# Patient Record
Sex: Female | Born: 1935 | Race: White | Hispanic: No | Smoking: Never smoker
Health system: Southern US, Community
[De-identification: ages and names within clinical notes are randomized; demographics above are authoritative.]

## PROBLEM LIST (undated history)

## (undated) DIAGNOSIS — I10 Essential (primary) hypertension: Secondary | ICD-10-CM

## (undated) DIAGNOSIS — E119 Type 2 diabetes mellitus without complications: Secondary | ICD-10-CM

## (undated) DIAGNOSIS — I639 Cerebral infarction, unspecified: Secondary | ICD-10-CM

## (undated) DIAGNOSIS — F0390 Unspecified dementia without behavioral disturbance: Secondary | ICD-10-CM

## (undated) DIAGNOSIS — C50919 Malignant neoplasm of unspecified site of unspecified female breast: Secondary | ICD-10-CM

## (undated) DIAGNOSIS — N1330 Unspecified hydronephrosis: Secondary | ICD-10-CM

## (undated) HISTORY — DX: Unspecified dementia, unspecified severity, without behavioral disturbance, psychotic disturbance, mood disturbance, and anxiety: F03.90

## (undated) HISTORY — DX: Type 2 diabetes mellitus without complications: E11.9

## (undated) HISTORY — DX: Cerebral infarction, unspecified: I63.9

## (undated) HISTORY — DX: Essential (primary) hypertension: I10

## (undated) HISTORY — PX: FOREIGN BODY REMOVAL: SHX962

## (undated) HISTORY — DX: Malignant neoplasm of unspecified site of unspecified female breast: C50.919

## (undated) HISTORY — PX: PERCUTANEOUS NEPHROSTOMY: SHX2208

## (undated) HISTORY — DX: Unspecified hydronephrosis: N13.30

---

## 1966-06-26 HISTORY — PX: OTHER SURGICAL HISTORY: SHX169

## 1998-05-13 ENCOUNTER — Other Ambulatory Visit: Admission: RE | Admit: 1998-05-13 | Discharge: 1998-05-13 | Payer: Self-pay | Admitting: *Deleted

## 1999-05-16 ENCOUNTER — Other Ambulatory Visit: Admission: RE | Admit: 1999-05-16 | Discharge: 1999-05-16 | Payer: Self-pay | Admitting: Family Medicine

## 2000-05-22 ENCOUNTER — Other Ambulatory Visit: Admission: RE | Admit: 2000-05-22 | Discharge: 2000-05-22 | Payer: Self-pay | Admitting: Family Medicine

## 2000-06-26 HISTORY — PX: BREAST LUMPECTOMY: SHX2

## 2001-07-09 ENCOUNTER — Other Ambulatory Visit: Admission: RE | Admit: 2001-07-09 | Discharge: 2001-07-09 | Payer: Self-pay | Admitting: Family Medicine

## 2001-12-24 ENCOUNTER — Encounter: Payer: Self-pay | Admitting: Family Medicine

## 2001-12-24 LAB — CONVERTED CEMR LAB: Hgb A1c MFr Bld: 5.4 %

## 2002-01-24 ENCOUNTER — Encounter: Payer: Self-pay | Admitting: Family Medicine

## 2002-01-24 LAB — CONVERTED CEMR LAB: Hgb A1c MFr Bld: 6.1 %

## 2002-07-27 ENCOUNTER — Encounter: Payer: Self-pay | Admitting: Family Medicine

## 2002-07-27 LAB — CONVERTED CEMR LAB: Hgb A1c MFr Bld: 5.6 %

## 2003-01-25 ENCOUNTER — Encounter: Payer: Self-pay | Admitting: Family Medicine

## 2003-01-25 LAB — CONVERTED CEMR LAB: Hgb A1c MFr Bld: 5.9 %

## 2003-02-04 ENCOUNTER — Other Ambulatory Visit: Admission: RE | Admit: 2003-02-04 | Discharge: 2003-02-04 | Payer: Self-pay | Admitting: Family Medicine

## 2003-02-04 ENCOUNTER — Encounter: Payer: Self-pay | Admitting: Family Medicine

## 2003-02-04 LAB — CONVERTED CEMR LAB: Pap Smear: NORMAL

## 2003-12-25 ENCOUNTER — Encounter: Payer: Self-pay | Admitting: Family Medicine

## 2003-12-25 LAB — CONVERTED CEMR LAB: Hgb A1c MFr Bld: 5.8 %

## 2004-06-26 ENCOUNTER — Encounter: Payer: Self-pay | Admitting: Family Medicine

## 2004-06-26 LAB — CONVERTED CEMR LAB: Hgb A1c MFr Bld: 5.4 %

## 2004-07-12 ENCOUNTER — Ambulatory Visit: Payer: Self-pay | Admitting: Family Medicine

## 2004-07-14 ENCOUNTER — Ambulatory Visit: Payer: Self-pay | Admitting: Family Medicine

## 2005-01-24 ENCOUNTER — Encounter: Payer: Self-pay | Admitting: Family Medicine

## 2005-02-07 ENCOUNTER — Ambulatory Visit: Payer: Self-pay | Admitting: Family Medicine

## 2005-02-09 ENCOUNTER — Ambulatory Visit: Payer: Self-pay | Admitting: Family Medicine

## 2005-02-24 ENCOUNTER — Ambulatory Visit: Payer: Self-pay | Admitting: Family Medicine

## 2005-07-27 ENCOUNTER — Encounter: Payer: Self-pay | Admitting: Family Medicine

## 2005-08-10 ENCOUNTER — Ambulatory Visit: Payer: Self-pay | Admitting: Family Medicine

## 2005-08-14 ENCOUNTER — Ambulatory Visit: Payer: Self-pay | Admitting: Family Medicine

## 2006-04-03 ENCOUNTER — Ambulatory Visit: Payer: Self-pay | Admitting: Family Medicine

## 2006-05-30 ENCOUNTER — Ambulatory Visit: Payer: Self-pay | Admitting: Family Medicine

## 2006-05-31 ENCOUNTER — Ambulatory Visit: Payer: Self-pay | Admitting: Family Medicine

## 2006-06-08 ENCOUNTER — Ambulatory Visit: Payer: Self-pay | Admitting: Family Medicine

## 2006-11-28 ENCOUNTER — Ambulatory Visit: Payer: Self-pay | Admitting: Family Medicine

## 2006-11-28 LAB — CONVERTED CEMR LAB: Hgb A1c MFr Bld: 5.9 % (ref 4.6–6.0)

## 2006-11-29 ENCOUNTER — Encounter: Payer: Self-pay | Admitting: Family Medicine

## 2006-11-29 DIAGNOSIS — E119 Type 2 diabetes mellitus without complications: Secondary | ICD-10-CM

## 2006-11-29 DIAGNOSIS — N200 Calculus of kidney: Secondary | ICD-10-CM | POA: Insufficient documentation

## 2006-11-29 DIAGNOSIS — I252 Old myocardial infarction: Secondary | ICD-10-CM

## 2006-11-29 DIAGNOSIS — K2960 Other gastritis without bleeding: Secondary | ICD-10-CM

## 2006-11-29 DIAGNOSIS — C50919 Malignant neoplasm of unspecified site of unspecified female breast: Secondary | ICD-10-CM | POA: Insufficient documentation

## 2006-11-29 DIAGNOSIS — I10 Essential (primary) hypertension: Secondary | ICD-10-CM | POA: Insufficient documentation

## 2006-11-29 DIAGNOSIS — G43909 Migraine, unspecified, not intractable, without status migrainosus: Secondary | ICD-10-CM | POA: Insufficient documentation

## 2006-11-30 ENCOUNTER — Ambulatory Visit: Payer: Self-pay | Admitting: Family Medicine

## 2007-01-31 ENCOUNTER — Ambulatory Visit: Payer: Self-pay | Admitting: Family Medicine

## 2007-02-04 ENCOUNTER — Encounter: Payer: Self-pay | Admitting: Family Medicine

## 2007-04-05 ENCOUNTER — Ambulatory Visit: Payer: Self-pay | Admitting: Family Medicine

## 2007-07-01 ENCOUNTER — Ambulatory Visit: Payer: Self-pay | Admitting: Family Medicine

## 2007-07-01 LAB — CONVERTED CEMR LAB
Albumin: 4.1 g/dL (ref 3.5–5.2)
Basophils Absolute: 0 10*3/uL (ref 0.0–0.1)
Cholesterol: 190 mg/dL (ref 0–200)
Creatinine, Ser: 1.2 mg/dL (ref 0.4–1.2)
HCT: 36.8 % (ref 36.0–46.0)
Hemoglobin: 12.7 g/dL (ref 12.0–15.0)
Hgb A1c MFr Bld: 5.8 % (ref 4.6–6.0)
LDL Cholesterol: 101 mg/dL — ABNORMAL HIGH (ref 0–99)
Lymphocytes Relative: 25.5 % (ref 12.0–46.0)
MCHC: 34.5 g/dL (ref 30.0–36.0)
MCV: 101.3 fL — ABNORMAL HIGH (ref 78.0–100.0)
Monocytes Absolute: 0.6 10*3/uL (ref 0.2–0.7)
Neutrophils Relative %: 64.2 % (ref 43.0–77.0)
Potassium: 4.9 meq/L (ref 3.5–5.1)
RDW: 11.9 % (ref 11.5–14.6)
Sodium: 143 meq/L (ref 135–145)
TSH: 1.21 microintl units/mL (ref 0.35–5.50)
Total Bilirubin: 1.3 mg/dL — ABNORMAL HIGH (ref 0.3–1.2)
Total Protein: 6.4 g/dL (ref 6.0–8.3)

## 2007-07-04 ENCOUNTER — Ambulatory Visit: Payer: Self-pay | Admitting: Family Medicine

## 2007-07-04 ENCOUNTER — Telehealth (INDEPENDENT_AMBULATORY_CARE_PROVIDER_SITE_OTHER): Payer: Self-pay | Admitting: *Deleted

## 2007-07-04 LAB — CONVERTED CEMR LAB: Microalb Creat Ratio: 8.7 mg/g (ref 0.0–30.0)

## 2007-07-12 ENCOUNTER — Ambulatory Visit: Payer: Self-pay | Admitting: Family Medicine

## 2007-07-15 ENCOUNTER — Encounter (INDEPENDENT_AMBULATORY_CARE_PROVIDER_SITE_OTHER): Payer: Self-pay | Admitting: *Deleted

## 2007-09-26 ENCOUNTER — Ambulatory Visit: Payer: Self-pay | Admitting: Family Medicine

## 2007-09-26 LAB — CONVERTED CEMR LAB
Bilirubin, Direct: 0.1 mg/dL (ref 0.0–0.3)
LDL Cholesterol: 90 mg/dL (ref 0–99)
Total Bilirubin: 0.9 mg/dL (ref 0.3–1.2)
Total CHOL/HDL Ratio: 2.6
VLDL: 15 mg/dL (ref 0–40)

## 2008-02-19 ENCOUNTER — Ambulatory Visit: Payer: Self-pay | Admitting: Family Medicine

## 2008-02-26 ENCOUNTER — Ambulatory Visit: Payer: Self-pay | Admitting: Family Medicine

## 2008-02-26 DIAGNOSIS — F0390 Unspecified dementia without behavioral disturbance: Secondary | ICD-10-CM

## 2008-06-04 ENCOUNTER — Encounter: Payer: Self-pay | Admitting: Family Medicine

## 2008-07-21 ENCOUNTER — Ambulatory Visit: Payer: Self-pay | Admitting: Family Medicine

## 2008-07-21 LAB — CONVERTED CEMR LAB
AST: 24 units/L (ref 0–37)
Albumin: 3.9 g/dL (ref 3.5–5.2)
Alkaline Phosphatase: 41 units/L (ref 39–117)
Basophils Absolute: 0 10*3/uL (ref 0.0–0.1)
CO2: 28 meq/L (ref 19–32)
Chloride: 109 meq/L (ref 96–112)
Cholesterol: 181 mg/dL (ref 0–200)
Creatinine,U: 130.6 mg/dL
Eosinophils Absolute: 0 10*3/uL (ref 0.0–0.7)
GFR calc Af Amer: 69 mL/min
Hgb A1c MFr Bld: 5.9 % (ref 4.6–6.0)
LDL Cholesterol: 88 mg/dL (ref 0–99)
Lymphocytes Relative: 24.6 % (ref 12.0–46.0)
MCHC: 34.6 g/dL (ref 30.0–36.0)
Microalb Creat Ratio: 23 mg/g (ref 0.0–30.0)
Neutro Abs: 4.4 10*3/uL (ref 1.4–7.7)
Neutrophils Relative %: 66.4 % (ref 43.0–77.0)
RDW: 11.9 % (ref 11.5–14.6)
Sodium: 145 meq/L (ref 135–145)
Total Protein: 6.5 g/dL (ref 6.0–8.3)
Triglycerides: 51 mg/dL (ref 0–149)
VLDL: 10 mg/dL (ref 0–40)
Vitamin B-12: 659 pg/mL (ref 211–911)

## 2008-07-22 LAB — CONVERTED CEMR LAB: Vit D, 1,25-Dihydroxy: 26 — ABNORMAL LOW (ref 30–89)

## 2008-07-28 ENCOUNTER — Ambulatory Visit: Payer: Self-pay | Admitting: Family Medicine

## 2008-08-13 ENCOUNTER — Encounter (INDEPENDENT_AMBULATORY_CARE_PROVIDER_SITE_OTHER): Payer: Self-pay | Admitting: *Deleted

## 2008-08-13 ENCOUNTER — Ambulatory Visit: Payer: Self-pay | Admitting: Family Medicine

## 2008-08-13 LAB — CONVERTED CEMR LAB
OCCULT 1: NEGATIVE
OCCULT 2: NEGATIVE

## 2008-08-17 ENCOUNTER — Encounter: Payer: Self-pay | Admitting: Family Medicine

## 2008-08-17 LAB — HM MAMMOGRAPHY: HM Mammogram: NORMAL

## 2009-01-19 ENCOUNTER — Ambulatory Visit: Payer: Self-pay | Admitting: Family Medicine

## 2009-02-02 ENCOUNTER — Ambulatory Visit: Payer: Self-pay | Admitting: Family Medicine

## 2009-02-02 DIAGNOSIS — M21619 Bunion of unspecified foot: Secondary | ICD-10-CM

## 2009-04-07 ENCOUNTER — Ambulatory Visit: Payer: Self-pay | Admitting: Family Medicine

## 2009-07-29 ENCOUNTER — Ambulatory Visit: Payer: Self-pay | Admitting: Family Medicine

## 2009-07-29 LAB — CONVERTED CEMR LAB
ALT: 17 units/L (ref 0–35)
AST: 25 units/L (ref 0–37)
Albumin: 3.8 g/dL (ref 3.5–5.2)
BUN: 27 mg/dL — ABNORMAL HIGH (ref 6–23)
Basophils Relative: 0.4 % (ref 0.0–3.0)
CO2: 30 meq/L (ref 19–32)
Calcium: 9.6 mg/dL (ref 8.4–10.5)
Chloride: 109 meq/L (ref 96–112)
Cholesterol: 166 mg/dL (ref 0–200)
Eosinophils Absolute: 0.1 10*3/uL (ref 0.0–0.7)
GFR calc non Af Amer: 56.5 mL/min (ref >60)
HCT: 36.1 % (ref 36.0–46.0)
Hemoglobin: 11.9 g/dL — ABNORMAL LOW (ref 12.0–15.0)
Lymphocytes Relative: 27.2 % (ref 12.0–46.0)
MCHC: 33 g/dL (ref 30.0–36.0)
MCV: 102.7 fL — ABNORMAL HIGH (ref 78.0–100.0)
Neutro Abs: 3.1 10*3/uL (ref 1.4–7.7)
Potassium: 5.1 meq/L (ref 3.5–5.1)
RBC: 3.51 M/uL — ABNORMAL LOW (ref 3.87–5.11)

## 2009-07-30 LAB — CONVERTED CEMR LAB: Vit D, 25-Hydroxy: 29 ng/mL — ABNORMAL LOW (ref 30–89)

## 2009-08-02 ENCOUNTER — Ambulatory Visit: Payer: Self-pay | Admitting: Family Medicine

## 2009-08-02 LAB — HM DIABETES FOOT EXAM

## 2009-11-08 ENCOUNTER — Telehealth: Payer: Self-pay | Admitting: Family Medicine

## 2009-11-10 ENCOUNTER — Ambulatory Visit: Payer: Self-pay | Admitting: Family Medicine

## 2009-12-15 ENCOUNTER — Ambulatory Visit: Payer: Self-pay | Admitting: Family Medicine

## 2009-12-22 ENCOUNTER — Telehealth: Payer: Self-pay | Admitting: Family Medicine

## 2010-02-01 ENCOUNTER — Encounter (INDEPENDENT_AMBULATORY_CARE_PROVIDER_SITE_OTHER): Payer: Self-pay | Admitting: *Deleted

## 2010-04-13 ENCOUNTER — Ambulatory Visit: Payer: Self-pay | Admitting: Family Medicine

## 2010-04-13 DIAGNOSIS — N3941 Urge incontinence: Secondary | ICD-10-CM

## 2010-04-13 LAB — CONVERTED CEMR LAB
Ketones, urine, test strip: NEGATIVE
Urobilinogen, UA: 0.2

## 2010-05-04 ENCOUNTER — Ambulatory Visit: Payer: Self-pay | Admitting: Family Medicine

## 2010-05-04 LAB — CONVERTED CEMR LAB
Bilirubin Urine: NEGATIVE
Glucose, Urine, Semiquant: NEGATIVE
Nitrite: POSITIVE
Specific Gravity, Urine: 1.015
pH: 5

## 2010-05-16 ENCOUNTER — Telehealth: Payer: Self-pay | Admitting: Family Medicine

## 2010-06-08 ENCOUNTER — Ambulatory Visit: Payer: Self-pay | Admitting: Family Medicine

## 2010-06-08 DIAGNOSIS — N390 Urinary tract infection, site not specified: Secondary | ICD-10-CM

## 2010-06-08 LAB — CONVERTED CEMR LAB
Bacteria, UA: 0
Glucose, Urine, Semiquant: NEGATIVE
Ketones, urine, test strip: NEGATIVE
Nitrite: NEGATIVE
RBC / HPF: 0
pH: 5

## 2010-06-09 ENCOUNTER — Encounter: Payer: Self-pay | Admitting: Family Medicine

## 2010-07-26 NOTE — Assessment & Plan Note (Signed)
Vital Signs:  Patient profile:   77 year old female Weight:      130 pounds Temp:     98.4 degrees F oral Pulse rate:   72 / minute Pulse rhythm:   regular BP sitting:   120 / 68  (left arm) Cuff size:   regular  Vitals Entered By: Sydell Axon LPN (August 02, 2009 9:16 AM) CC: 30 Minute checkup   History of Present Illness: Pt here for followup. She worked on her bunion herself and "finally got rid of it."  She has no complaints.   Preventive Screening-Counseling & Management  Alcohol-Tobacco     Alcohol drinks/day: 0     Smoking Status: never     Passive Smoke Exposure: no  Caffeine-Diet-Exercise     Caffeine use/day: 1     Does Patient Exercise: yes     Type of exercise: stepper, walks in neighborhood     Times/week: 7  Problems Prior to Update: 1)  Bunion, Left Foot  (ICD-727.1) 2)  Dementia, Senile  (ICD-290.0) 3)  Special Screening Malig Neoplasms Other Sites  (ICD-V76.49) 4)  Infiltrating Ductal Carcinoma, Left Breast(dr. 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 One Tab By Mouth At Night 2)  Multivitamin With B12 .... Take One By Mouth Daily 3)  Aspirin 81 Mg Tabs (Aspirin) .... Take Two  By Mouth Daily 4)  Stool Softener 100 Mg Caps (Docusate Sodium) .... O.t.c. Prn 5)  Lisinopril 10 Mg  Tabs (Lisinopril) .Marland Kitchen.. 1 Daily By Mouth 6)  Vitamin B-12 250 Mcg Tabs (Cyanocobalamin) .Marland Kitchen.. 1 Daily By Mouth  Allergies: No Known Drug Allergies  Past History:  Past  Medical History: Last updated: 11/29/2006 Diabetes mellitus, type II Hypertension Myocardial infarction, hx of  Past Surgical History: Last updated: 07/04/2007 NSVD x2 R Milk Duct Removal 1968 L Hand FB removal (thorn) Percutaneous Nephrostomy Head CT, ? early infarct 06/98 ECHO ? 06/98 Carotid U/S 50-60% stenosis 06/98 MRA Head wnl MRII right  coronary artery, infarct 06/98 Kidney sonogram mild left hydronephrosis 06/98 Abd/renal CT, IVP negative stenosis (Stoioff) 03/01 L breast Lumpectomy 2002  Family History: Last updated: 2008-08-02 Father: Died at age 33 Mother: Died at age 5, with hypertension and stroke Siblings: Six brothers, one deceased from congestive heart failure, one from stroke, two brothers still living, one has had a stroke and one had throat cancer.  Three sisters-one died of congestive heart failure, one of lung cancer, and one of diabetes.  Social History: Last updated: 11/30/2006 Marital Status: Married/now a widow Children: 3, out of home Occupation: Retired from [employer]  Risk Factors: Alcohol Use: 0 (08/02/2009) Caffeine Use: 1 (08/02/2009) Exercise: yes (08/02/2009)  Risk Factors: Smoking Status: never (08/02/2009) Passive Smoke Exposure: no (08/02/2009)  Social History: Does Patient Exercise:  yes  Review of Systems General:  Denies chills, fatigue, fever, sweats, weakness, and weight loss. Eyes:  Denies blurring, discharge, and eye pain. ENT:  Denies decreased hearing, earache, and ringing in ears. CV:  Denies  chest pain or discomfort, fainting, fatigue, palpitations, shortness of breath with exertion, and swelling of feet. Resp:  Denies cough, shortness of breath, and wheezing. GI:  Denies abdominal pain, bloody stools, change in bowel habits, constipation, dark tarry stools, diarrhea, indigestion, loss of appetite, nausea, vomiting, vomiting blood, and yellowish skin color. GU:  Denies dysuria, nocturia, and urinary frequency. MS:   Denies joint pain, low back pain, muscle aches, and stiffness. Derm:  Denies dryness, itching, and rash. Neuro:  Denies numbness, poor balance, tingling, and tremors.  Physical Exam  General:  Well-developed,well-nourished,in no acute distress; alert,appropriate and cooperative throughout examination, pleasant and interacive with hearing loss, hearing aids in  place.. Head:  Normocephalic and atraumatic without obvious abnormalities. No apparent alopecia or balding. Eyes:  Conjunctiva clear bilaterally.  Ears:  External ear exam shows no significant lesions or deformities.  Otoscopic examination reveals clear canals, tympanic membranes are intact bilaterally without bulging, retraction, inflammation or discharge. Hearing is poor with hearing aids in place.. Nose:  External nasal examination shows no deformity or inflammation. Nasal mucosa are pink and moist without lesions or exudates. Mouth:  Oral mucosa and oropharynx without lesions or exudates.  Teeth in good repair. Neck:  No deformities, masses, or tenderness noted. Chest Wall:  No deformities, masses, or tenderness noted. Lungs:  Normal respiratory effort, chest expands symmetrically. Lungs are clear to auscultation, no crackles or wheezes. Heart:  Normal rate and regular rhythm. S1 and S2 normal without gallop, murmur, click, rub or other extra sounds. Abdomen:  Bowel sounds positive,abdomen soft and non-tender without masses, organomegaly or hernias noted. Msk:  No deformity or scoliosis noted of thoracic or lumbar spine.   Pulses:  R and L carotid,radial,femoral,dorsalis pedis and posterior tibial pulses are full and equal bilaterally Extremities:  No clubbing, cyanosis, edema, or deformity noted with normal full range of motion of all joints.   Neurologic:  No cranial nerve deficits noted. Station and gait are normal.  Sensory, motor and coordinative functions appear intact. Skin:  Intact without suspicious lesions or rashes, mild  echymoses of wrist area bilat. Cervical Nodes:  No lymphadenopathy noted Inguinal Nodes:  No significant adenopathy Psych:  Cognition and judgment appear intact. Alert and cooperative with normal attention span and concentration. No apparent delusions, illusions, hallucinations  Diabetes Management Exam:    Foot Exam (with socks and/or shoes not present):       Sensory-Pinprick/Light touch:          Left medial foot (L-4): normal          Left dorsal foot (L-5): normal          Left lateral foot (S-1): normal          Right medial foot (L-4): normal          Right dorsal foot (L-5): normal          Right lateral foot (S-1): normal       Sensory-Monofilament:          Left foot: normal          Right foot: normal       Inspection:          Left foot: abnormal             Comments: Bunion smaller thanlasty time.          Right foot: normal       Nails:          Left foot: thickened  Right foot: thickened   Impression & Recommendations:  Problem # 1:  DEMENTIA, SENILE (ICD-290.0) Assessment Unchanged Seems stable. pt perseverates a lot but conversant.  Problem # 2:  BUNION, LEFT FOOT (ICD-727.1) Assessment: Improved Sliightly smaller today. Stable. Declined podiatry for orthotics.  Problem # 3:  HYPERCHOLESTEROLEMIA, 217 /LDL- 126 (ICD-272.0) Assessment: Improved Great nos, connt meds and diet. Her updated medication list for this problem includes:    Zocor 80 Mg Tabs (Simvastatin) ..... One tab by mouth at night  Labs Reviewed: SGOT: 25 (07/29/2009)   SGPT: 17 (07/29/2009)   HDL:78.80 (07/29/2009), 82.4 (07/21/2008)  LDL:75 (07/29/2009), 88 (07/21/2008)  Chol:166 (07/29/2009), 181 (07/21/2008)  Trig:62.0 (07/29/2009), 51 (07/21/2008)  Problem # 4:  MACROCYTIC ANEMIAWITH DECREASED B12 (ICD-281.9) Assessment: Improved  Stable. Her updated medication list for this problem includes:    Vitamin B-12 250 Mcg Tabs (Cyanocobalamin) .Marland Kitchen... 1 daily by mouth  Hgb: 11.9  (07/29/2009)   Hct: 36.1 (07/29/2009)   Platelets: 148.0 (07/29/2009) RBC: 3.51 (07/29/2009)   RDW: 12.4 (07/29/2009)   WBC: 5.1 (07/29/2009) MCV: 102.7 (07/29/2009)   MCHC: 33.0 (07/29/2009) Iron: 111 (07/29/2009)   % Sat: 38.2 (07/21/2008) B12: 1272 (07/29/2009)   TSH: 1.60 (07/29/2009)  Problem # 5:  HYPERTENSION (ICD-401.9) Assessment: Unchanged Good control. Her updated medication list for this problem includes:    Lisinopril 10 Mg Tabs (Lisinopril) .Marland Kitchen... 1 daily by mouth  BP today: 120/68 Prior BP: 90/60 (02/02/2009)  Labs Reviewed: K+: 5.1 (07/29/2009) Creat: : 1.0 (07/29/2009)   Chol: 166 (07/29/2009)   HDL: 78.80 (07/29/2009)   LDL: 75 (07/29/2009)   TG: 62.0 (07/29/2009)  Problem # 6:  AODM (ICD-250.00) A1C not done this time. Cont curr diet and exercise. Needs eye exam. Follow up in 6 mos. Her updated medication list for this problem includes:    Aspirin 81 Mg Tabs (Aspirin) .Marland Kitchen... Take two  by mouth daily    Lisinopril 10 Mg Tabs (Lisinopril) .Marland Kitchen... 1 daily by mouth  Labs Reviewed: Creat: 1.0 (07/29/2009)   Microalbumin: 9.2 (05/31/2006) Reviewed HgBA1c results: 5.9 (01/19/2009)  5.9 (07/21/2008)  Complete Medication List: 1)  Zocor 80 Mg Tabs (Simvastatin) .... One tab by mouth at night 2)  Multivitamin With B12  .... Take one by mouth daily 3)  Aspirin 81 Mg Tabs (Aspirin) .... Take two  by mouth daily 4)  Stool Softener 100 Mg Caps (Docusate sodium) .... O.t.c. as needed 5)  Lisinopril 10 Mg Tabs (Lisinopril) .Marland Kitchen.. 1 daily by mouth 6)  Vitamin B-12 250 Mcg Tabs (Cyanocobalamin) .Marland Kitchen.. 1 daily by mouth  Patient Instructions: 1)  Needs eye exam. 2)  RTC 6 mos, needs A1C then.  Current Allergies (reviewed today): No known allergies

## 2010-07-26 NOTE — Assessment & Plan Note (Signed)
Summary: CONFUSION/CLE   Vital Signs:  Patient profile:   77 year old female Weight:      131.50 pounds BMI:     23.38 Temp:     98.1 degrees F oral Pulse rate:   60 / minute Pulse rhythm:   regular BP sitting:   120 / 68  (left arm) Cuff size:   regular  Vitals Entered By: Sydell Axon LPN (Nov 10, 2009 11:58 AM) CC: Confusion   History of Present Illness: Pt here with daughter. She has been having a lot of confusion and lives alone. She has been goingg to bed real early then waking up at what would be a normal bedtime and wanting to interact with everyone, sometimes going outside. She lives across the road from her son but is often in bed when he gets home from work. The family is receptive to using medication. She has not had any known falos and she has had dementia progressing slowly for quite a while, no acute change buit progressive worsening.  Problems Prior to Update: 1)  Bunion, Left Foot  (ICD-727.1) 2)  Dementia, Senile  (ICD-290.0) 3)  Special Screening Malig Neoplasms Other Sites  (ICD-V76.49) 4)  Infiltrating Ductal Carcinoma, Left Breast(dr. 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 One Tab By Mouth At Night 2)  Multivitamin With B12 .... Take One By Mouth Daily 3)  Aspirin 81 Mg Tabs (Aspirin) .... Take Two  By Mouth Daily 4)  Stool Softener 100 Mg Caps (Docusate Sodium) .... O.t.c. As Needed 5)  Lisinopril 10 Mg  Tabs (Lisinopril) .Marland Kitchen.. 1 Daily By Mouth 6)  Vitamin  B-12 250 Mcg Tabs (Cyanocobalamin) .Marland Kitchen.. 1 Daily By Mouth  Allergies: No Known Drug Allergies  Physical Exam  General:  Well-developed,well-nourished,in no acute distress; alert,appropriate and cooperative throughout examination, pleasant and interacive with hearing loss, hearing aids in  place.. Head:  Normocephalic and atraumatic without obvious abnormalities. No apparent alopecia or balding. Eyes:  Conjunctiva clear bilaterally.  Ears:  External ear exam shows no significant lesions or deformities.  Otoscopic examination reveals clear canals, tympanic membranes are intact bilaterally without bulging, retraction, inflammation or discharge. Hearing is poor with hearing aids in place.. Nose:  External nasal examination shows no deformity or inflammation. Nasal mucosa are pink and moist without lesions or exudates. Mouth:  Oral mucosa and oropharynx without lesions or exudates.  Teeth in good repair. Neck:  No deformities, masses, or tenderness noted. Lungs:  Normal respiratory effort, chest expands symmetrically. Lungs are clear to auscultation, no crackles or wheezes. Heart:  Normal rate and regular rhythm. S1 and S2 normal without gallop, murmur, click, rub or other extra sounds.   Impression & Recommendations:  Problem # 1:  DEMENTIA, SENILE (ICD-290.0) Assessment Deteriorated  Will start medication. MMS and MMX markedly abnml, 19/30 w/speeling world...17/30 w/serial sevens....27/50 MMX . AD8  8/8.  See instructions.  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  Then try just keeping her up until nml bedtime . If fails, give Ambien regularly. Lotys of light during day, reduced light at bedtime.  Complete Medication List: 1)  Zocor 80 Mg Tabs (Simvastatin) .... One tab by mouth at night 2)   Multivitamin With B12  .... Take one by mouth daily 3)  Aspirin 81 Mg Tabs (Aspirin) .... Take two  by mouth daily 4)  Stool Softener 100 Mg Caps (Docusate sodium) .... O.t.c. as needed 5)  Lisinopril 10 Mg Tabs (Lisinopril) .Marland Kitchen.. 1 daily by mouth 6)  Vitamin B-12 250 Mcg Tabs (Cyanocobalamin) .Marland Kitchen.. 1 daily by mouth 7)  Aricept 5 Mg Tabs (Donepezil hcl) .... One tab by mouth once daily 8)  Namenda 10 Mg Tabs (Memantine hcl) 9)  Ambien 10 Mg Tabs (Zolpidem tartrate) .... 1/2 - 1 tab at night  Patient Instructions: 1)  RTC 5 weeks for recheckStart Ambien tonite with help. 2)  Use for one week. 3)  Then cont help another week. 4)  Start Aricept in 2 weeks. 5)  Start Namenda 2 weeks later. Prescriptions: AMBIEN 10 MG TABS (ZOLPIDEM TARTRATE) 1/2 - 1 tab at night  #30 x 0   Entered and Authorized by:   Shaune Leeks MD   Signed by:   Shaune Leeks MD on 11/10/2009   Method used:   Print then Give to Patient   RxID:   [id number] ARICEPT 5 MG TABS (DONEPEZIL HCL) one tab by mouth once daily  #30 x 12   Entered and Authorized by:   Shaune Leeks MD   Signed by:   Shaune Leeks MD on 11/10/2009   Method used:   Electron[facility] to        [facility] [phone]* (retail)       [address] [place], [place]  [address]       Ph: [phone] or [phone]       Fax: [phone]   RxID:   [id number]   Current Allergies (reviewed today): No known allergies

## 2010-07-26 NOTE — Progress Notes (Signed)
Summary: ok to take cough medicine?  Phone Note Call from Patient   Caller: Daughter  Morici  [phone] Reason for Call: Referral Summary of Call: Pt is complaining of a cough, tickle in her throat.  Her daughter is asking if ok for her to take otc cough meds if taking aricept.  Initial call taken by: Lowella Petties CMA, AAMA,  May 16, 2010 8:58 AM  Follow-up for Phone Call        I would use plain guiafenisen OTC cough meds, starting with a low dose.  I wouldn't use meds that had benadryl or other meds that can cause more sedation.  Make sure to drink plenty of water with the cough medicine.  Follow-up by: Crawford Givens MD,  May 16, 2010 11:20 AM  Additional Follow-up for Phone Call Additional follow up Details #1::        Left message on voicemail  in detail.  Personalized VM. Lugene Fuquay CMA Duncan Dull)  May 16, 2010 11:40 AM

## 2010-07-26 NOTE — Assessment & Plan Note (Signed)
Summary: 5 WEEK FOLLOW UP FOR RECHECK/RBH   Vital Signs:  Patient profile:   77 year old female Weight:      129.75 pounds Temp:     98.4 degrees F oral Pulse rate:   64 / minute Pulse rhythm:   regular BP sitting:   110 / 70  (left arm) Cuff size:   regular  Vitals Entered By: Sydell Axon LPN (December 15, 2009 12:01 PM) CC: 5 Week follow-up, discuss medications   History of Present Illness: Pt here with daughter for five week followup. She was here last time for dementia and sleep dyssynchrony and meds were prescribed in stepwise fashion to address those issues, first attempts at normalizing sleep, then starting meds for demwntia, Aricept and Namenda. Her son took over control of her medications and started giving her meds each day after supper time. As a result, she definitely got her meds regularly and in the proper doses and her sleep pattern normalized. He gets a meal for her daily for supper and brings it by as well. He then began only the Namenda via the start pack and she seemingly has improved with now calling her children again like she used to and acting/interacting more normally. She denies difficulties or problems today and feels well.  Problems Prior to Update: 1)  Circadian Rhythm Sleep D/o Irreg Sleepwake Type  (ICD-327.33) 2)  Bunion, Left Foot  (ICD-727.1) 3)  Dementia, Senile  (ICD-290.0) 4)  Special Screening Malig Neoplasms Other Sites  (ICD-V76.49) 5)  Infiltrating Ductal Carcinoma, Left Breast(dr. 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 One Tab By Mouth At Night 2)  Multivitamin With B12 .... Take One By Mouth Daily 3)  Aspirin 81 Mg Tabs (Aspirin) .... Take Two  By Mouth Daily 4)  Stool Softener 100 Mg Caps (Docusate Sodium) .... O.t.c. As Needed 5)  Lisinopril 10 Mg  Tabs (Lisinopril) .Marland Kitchen.. 1 Daily By Mouth 6)  Vitamin B-12 250 Mcg Tabs (Cyanocobalamin) .Marland Kitchen.. 1 Daily By Mouth 7)  Aricept 5 Mg Tabs (Donepezil Hcl) .... One Tab By Mouth Once Daily 8)  Namenda 10 Mg Tabs (Memantine Hcl) 9)  Ambien 10 Mg Tabs (Zolpidem Tartrate) .... 1/2 - 1 Tab At Night  Allergies: No Known Drug Allergies  Physical Exam  General:  Well-developed,well-nourished,in no acute distress; alert,appropriate and cooperative throughout examination, pleasant and interacive with hearing loss, hearing aids in  place. Has her sparkle in her eyes again. Head:  Normocephalic and atraumatic without obvious abnormalities. No apparent alopecia or balding. Sinuses NT. Eyes:  Conjunctiva clear bilaterally.  Ears:  Not examined, hearing aids in place. Nose:  External nasal examination shows no deformity or inflammation. Nasal mucosa are pink and moist without lesions or exudates. Mouth:  Oral mucosa and oropharynx without lesions or exudates.  Teeth in good repair. Neck:  No deformities, masses, or tenderness noted. Chest Wall:  No deformities, masses, or tenderness noted. Lungs:  Normal respiratory effort, chest expands symmetrically. Lungs are clear to auscultation,  no crackles or wheezes. Heart:  Normal rate and regular rhythm. S1 and S2 normal without gallop, murmur, click, rub or other extra sounds. Neurologic:  Walks hesitantly but without difficulty. Psych:  Cognition and judgment appear intact. Alert and cooperative with normal attention span and concentration. No apparent delusions, illusions, hallucinations   Impression & Recommendations:  Problem # 1:  CIRCADIAN  RHYTHM SLEEP D/O IRREG SLEEPWAKE TYPE (ICD-327.33) Assessment Improved Much better. Sleeping the whole night, not out inappropriately and feeling and acting much better.  Problem # 2:  DEMENTIA, SENILE (ICD-290.0) Assessment: Unchanged Unchanged in my eyes but improved via the family. Cont only Namenda. May add Aricept in the future if dementia acccelerates.  Problem # 3:  HYPERTENSION (ICD-401.9) Assessment: Unchanged Well ciontrolled. Her updated medication list for this problem includes:    Lisinopril 10 Mg Tabs (Lisinopril) .Marland Kitchen... 1 daily by mouth  BP today: 110/70 Prior BP: 120/68 (11/10/2009)  Labs Reviewed: K+: 5.1 (07/29/2009) Creat: : 1.0 (07/29/2009)   Chol: 166 (07/29/2009)   HDL: 78.80 (07/29/2009)   LDL: 75 (07/29/2009)   TG: 62.0 (07/29/2009)  Complete Medication List: 1)  Zocor 80 Mg Tabs (Simvastatin) .... One tab by mouth at night 2)  Multivitamin With B12  .... Take one by mouth daily 3)  Aspirin 81 Mg Tabs (Aspirin) .... Take two  by mouth daily 4)  Stool Softener 100 Mg Caps (Docusate sodium) .... O.t.c. as needed 5)  Lisinopril 10 Mg Tabs (Lisinopril) .Marland Kitchen.. 1 daily by mouth 6)  Vitamin B-12 250 Mcg Tabs (Cyanocobalamin) .Marland Kitchen.. 1 daily by mouth 7)  Aricept 5 Mg Tabs (Donepezil hcl) .... One tab by mouth once daily 8)  Namenda 10 Mg Tabs (Memantine hcl) 9)  Ambien 10 Mg Tabs (Zolpidem tartrate) .... 1/2 - 1 tab at night  Patient Instructions: 1)  RTC 6 months.  Current Allergies (reviewed today): No known allergies

## 2010-07-26 NOTE — Letter (Signed)
Summary: AD8 Dementia Screening Interview Form  AD8 Dementia Screening Interview Form   Imported By: Beau Fanny 11/10/2009 14:49:51  _____________________________________________________________________  External Attachment:    Type:   Image     Comment:   External Document

## 2010-07-26 NOTE — Progress Notes (Signed)
Summary: requests namenda  Phone Note Call from Patient   Caller: Daughter Call For: Shaune Leeks MD Summary of Call: Pt was given samples of namenda and will soon finish the starter pack.  Daughter is asking if you will send in a script.  She says this is working well for the pt.  [facility] [address] Initial call taken by: Lowella Petties CMA,  December 22, 2009 9:36 AM    New/Updated Medications: NAMENDA 10 MG TABS (MEMANTINE HCL) one tab by mouth two times a day Prescriptions: NAMENDA 10 MG TABS (MEMANTINE HCL) one tab by mouth two times a day  #60 x 12   Entered and Authorized by:   Shaune Leeks MD   Signed by:   Shaune Leeks MD on 12/22/2009   Method used:   [facility]        [facility] [phone][facility] (retail)       [address] [place], [place]  [address]       Ph: [phone] or [phone]       Fax: [phone]   RxID:   [id number]

## 2010-07-26 NOTE — Assessment & Plan Note (Signed)
Summary: f/u on alzheimer/dlo   Vital Signs:  Patient profile:   77 year old female Weight:      132 pounds Temp:     97.8 degrees F oral Pulse rate:   60 / minute Pulse rhythm:   regular BP sitting:   110 / 68  (left arm) Cuff size:   regular  Vitals Entered By: Sydell Axon LPN (April 13, 2010 10:05 AM) CC: Follow-up on dementia and incontinence   History of Present Illness: Pt here with her daughter to followup dementia on Aricept nand Namenda. Is doing well and still calling her children appropriately. Son still fills her pill box for her and she seems stable. She is now having problems with urine leakage and nighttime bedwetting. She denies pain or irritation on urination but often soaks the bed at night. She does not have glaucoma. She otherwise feels well with no complaints. She had her ears flushed at the hearing aid store in [place] a few weeks ago.  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 One Tab By Mouth At Night 2)  Multivitamin With B12 .... Take One By Mouth Daily 3)  Aspirin 81 Mg Tabs (Aspirin) .... Take Two  By Mouth Daily 4)  Stool Softener 100 Mg  Caps (Docusate Sodium) .... O.t.c. As Needed 5)  Lisinopril 10 Mg  Tabs (Lisinopril) .Marland Kitchen.. 1 Daily By Mouth 6)  Vitamin B-12 250 Mcg Tabs (Cyanocobalamin) .Marland Kitchen.. 1 Daily By Mouth 7)  Aricept 5 Mg Tabs (Donepezil Hcl) .... One Tab By Mouth Once Daily 8)  Namenda 10 Mg Tabs (Memantine Hcl) .... One Tab By Mouth Two Times A Day 9)  Ambien 10 Mg Tabs (Zolpidem Tartrate) .... 1/2 - 1 Tab At Night  Allergies: No Known Drug Allergies  Physical Exam  General:  Well-developed,well-nourished,in no acute distress; alert,appropriate and cooperative throughout examination, pleasant and interacive with hearing loss, hearing aids in  place.  Head:  Normocephalic and atraumatic without obvious abnormalities. No apparent alopecia or balding. Sinuses NT. Eyes:  Conjunctiva clear bilaterally.  Ears:  Hearing aids in place, right canal clear and TM nml.. Nose:  External nasal examination shows no deformity or inflammation. Nasal mucosa are pink and moist without lesions or exudates. Mouth:  Oral mucosa and oropharynx without lesions or exudates.  Teeth in good repair. Neck:  No deformities, masses, or tenderness noted. Lungs:  Normal respiratory effort, chest expands symmetrically. Lungs are clear to auscultation, no crackles or wheezes. Heart:  Normal rate and regular rhythm. S1 and S2 normal without gallop, murmur, click, rub or other extra sounds. Abdomen:  Bowel sounds positive,abdomen soft and non-tender without masses, organomegaly or hernias noted. No suprapubic tenderness. Msk:  No CVAT.  Impression & Recommendations:  Problem # 1:  DEMENTIA, SENILE (ICD-290.0) Assessment Unchanged Seems stable. Discussed ability to increase Aricept if so desired. Will discuss with family.  Problem # 2:  HYPERTENSION (ICD-401.9) Assessment: Unchanged  Her updated medication list for this problem includes:    Lisinopril 10 Mg Tabs (Lisinopril) .Marland Kitchen... 1 daily by mouth  BP today: 110/68 Prior BP: 110/70  (12/15/2009)  Labs Reviewed: K+: 5.1 (07/29/2009) Creat: : 1.0 (07/29/2009)   Chol: 166 (07/29/2009)   HDL: 78.80 (07/29/2009)   LDL: 75 (07/29/2009)   TG: 62.0 (07/29/2009)  Problem # 3:  AODM (ICD-250.00) Assessment: Unchanged Will cont to follow. Glu checked on Dip. Was negative. Cont diet control. Her updated medication list for this problem includes:    Aspirin 81 Mg Tabs (Aspirin) .Marland Kitchen... Take two  by mouth daily    Lisinopril 10 Mg Tabs (Lisinopril) .Marland Kitchen... 1 daily by mouth  Labs Reviewed: Creat: 1.0 (07/29/2009)   Microalbumin: 9.2 (05/31/2006) Reviewed HgBA1c results: 5.9 (01/19/2009)  5.9 (07/21/2008)  Problem # 4:  INCONTINENCE, URGE (ICD-788.31) Assessment: New Will check urine to insure no UTI...has no sxs. U/A with lots of bacteria...will culture. If U/C neg, start Ditropan 5mg  once a day in AM for one week, then go to two times a day. Recheck 3 mos. If U/C positive, treat for ten days and then asssess need for medication. If stilll leaking, start meds. If not, hold off.  Complete Medication List: 1)  Zocor 80 Mg Tabs (Simvastatin) .... One tab by mouth at night 2)  Multivitamin With B12  .... Take one by mouth daily 3)  Aspirin 81 Mg Tabs (Aspirin) .... Take two  by mouth daily 4)  Stool Softener 100 Mg Caps (Docusate sodium) .... O.t.c. as needed 5)  Lisinopril 10 Mg Tabs (Lisinopril) .Marland Kitchen.. 1 daily by mouth 6)  Aricept 5 Mg Tabs (Donepezil hcl) .... One tab by mouth once daily 7)  Namenda 10 Mg Tabs (Memantine hcl) .... One tab by mouth two times a day 8)  Vitamin B-12 Cr 2000 Mcg Cr-tabs (Cyanocobalamin) .... Take one by mouth daily 9)  Oxybutynin Chloride 5 Mg Tabs (Oxybutynin chloride) .... One tab by mouth two times a day  Other Orders: Flu Vaccine 11yrs + MEDICARE PATIENTS (Z6109) Administration Flu vaccine - MCR (G0008) UA Dipstick W/ Micro (manual) (60454)  Patient Instructions: 1)  RTC 3 mos. Prescriptions: OXYBUTYNIN CHLORIDE 5 MG TABS (OXYBUTYNIN  CHLORIDE) one tab by mouth two times a day  #60 x 0   Entered and Authorized by:   Shaune Leeks MD   Signed by:   Shaune Leeks MD on 04/13/2010   Method used:   Electronically to        [facility] [phone]* (retail)       [address] [place], [place]  [address]       Ph: [phone] or [phone]       Fax: [phone]   RxID:   [id number]    Orders Added: 1)  Flu Vaccine 55yrs + MEDICARE PATIENTS [Q2039] 2)  Administration Flu vaccine - MCR [G0008] 3)  UA Dipstick W/ Micro (manual) [81000] 4)  Est. Patient Level IV [25956]    Current Allergies (reviewed today): No known allergies   Flu Vaccine Consent Questions     Do you have a history of severe allergic reactions to this vaccine? no  Any prior history of allergic reactions to egg and/or gelatin? no    Do you have a sensitivity to the preservative Thimersol? no    Do you have a past history of Guillan-Barre Syndrome? no    Do you currently have an acute febrile illness? no    Have you ever had a severe reaction to latex? no    Vaccine information given and explained to patient? yes    Are you currently pregnant? no    Lot Number:[id number]   Exp Date:12/24/2010   Site Given  Left Deltoid [id number]  Laboratory Results   Urine Tests  Date/Time Received: April 13, 2010 10:43 AM  Date/Time Reported: April 13, 2010 10:43 AM   Routine Urinalysis   Color: yellow Appearance: Cloudy Glucose: negative   (Normal Range: Negative) Bilirubin: negative   (Normal Range: Negative) Ketone: negative   (Normal Range: Negative) Spec. Gravity: 1.020   (Normal Range: 1.003-1.035) Blood: trace-intact   (Normal Range: Negative) pH: 5.0   (Normal Range: 5.0-8.0) Protein: trace   (Normal Range: Negative) Urobilinogen: 0.2   (Normal Range: 0-1) Nitrite: positive   (Normal Range: Negative) Leukocyte Esterace: small   (Normal Range: Negative)  Urine Microscopic WBC/HPF:  2-4 RBC/HPF: 0-1 Bacteria/HPF: 4+ Epithelial/HPF: rare       Appended Document: f/u on alzheimer/dlo   Appended Document: f/u on alzheimer/dlo    Clinical Lists Changes  Orders: Added new Service order of Prescription Created Electronically [phone]) - Signed

## 2010-07-26 NOTE — Assessment & Plan Note (Signed)
Summary: FINISHED ANTITIOBIC/FU/DLO   Vital Signs:  Patient profile:   77 year old female Weight:      132 pounds Temp:     97.9 degrees F oral Pulse rate:   60 / minute Pulse rhythm:   regular BP sitting:   124 / 78  (left arm) Cuff size:   regular  Vitals Entered By: Sydell Axon LPN (May 04, 2010 11:56 AM) CC: Recheck urine   History of Present Illness: Pt here with her daughter for followup of UTI, culture proven with nosturia and bedwetting with apparent urgency and difficulty getting to the bathroom in time to successfully void. These sxs have improved since being on antibiotic and today things seem to be better. She is having a difficult time providing a sample here for test of cure. She has no other complaints at this point. Her hearing is getting worse and her dementia is unchanged.  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 One Tab By Mouth At Night 2)  Multivitamin With B12 .... Take One By Mouth Daily 3)  Aspirin 81 Mg Tabs (Aspirin) .... Take Two  By Mouth Daily 4)  Stool Softener 100 Mg Caps (Docusate Sodium) .... O.t.c. As  Needed 5)  Lisinopril 10 Mg  Tabs (Lisinopril) .Marland Kitchen.. 1 Daily By Mouth 6)  Aricept 5 Mg Tabs (Donepezil Hcl) .... One Tab By Mouth Once Daily 7)  Namenda 10 Mg Tabs (Memantine Hcl) .... One Tab By Mouth Two Times A Day 8)  Vitamin B-12 Cr 2000 Mcg Cr-Tabs (Cyanocobalamin) .... Take One By Mouth Daily 9)  Oxybutynin Chloride 5 Mg Tabs (Oxybutynin Chloride) .... One Tab By Mouth Two Times A Day  Allergies: No Known Drug Allergies  Physical Exam  General:  Well-developed,well-nourished,in no acute distress; alert,appropriate and cooperative throughout examination, pleasant and interacive with hearing loss, hearing aids in  place.  Abdomen:  No suprapubic tenderness. Msk:  No CVAT.   Impression & Recommendations:  Problem # 1:  INCONTINENCE, URGE (ICD-788.31) Assessment Improved Apears to be due to UTI. Will follow. Hold off on Oxybutinin at this point. If sxs recur with no evidence of infection, may start Oxybutinin then...was warned about dry mouth.  Problem # 2:  UTI (ICD-599.0) Assessment: Improved  Appears resolved. Will send urine for TOC. Her updated medication list for this problem includes:    Oxybutynin Chloride 5 Mg Tabs (Oxybutynin chloride) ..... One tab by mouth two times a day  Orders: UA Dipstick W/ Micro (manual) (16109)  Complete Medication List: 1)  Zocor 80 Mg Tabs (Simvastatin) .... One tab by mouth at night 2)  Aspirin 81 Mg Tabs (Aspirin) .... Take two  by  mouth daily 3)  Stool Softener 100 Mg Caps (Docusate sodium) .... O.t.c. as needed 4)  Lisinopril 10 Mg Tabs (Lisinopril) .Marland Kitchen.. 1 daily by mouth 5)  Aricept 5 Mg Tabs (Donepezil hcl) .... One tab by mouth once daily 6)  Namenda 10 Mg Tabs (Memantine hcl) .... One tab by mouth two times a day 7)  Vitamin B-12 Cr 2000 Mcg Cr-tabs (Cyanocobalamin) .... Take one by mouth daily 8)  Oxybutynin Chloride 5 Mg Tabs (Oxybutynin chloride) .... One tab by mouth two times a day 9)  Multivitamins Tabs (Multiple vitamin)  .... Take one by mouth daily  Other Orders: Specimen Handling (16109) T-Culture, Urine ([id number])  Patient Instructions: 1)  RTC as needed. 2)  Due to difficulty providing sample, may do at home and bring back....desribed clean catch technique. 3)  If sxs recur, may try Oxybutinin.   Orders Added: 1)  Est. Patient Level III [91478] 2)  UA Dipstick W/ Micro (manual) [81000] 3)  Specimen Handling [99000] 4)  T-Culture, Urine [[id number]]    Current Allergies (reviewed today): No known allergies   Laboratory Results   Urine Tests  Date/Time Received: May 04, 2010 2:16 PM  Date/Time Reported: May 04, 2010 2:17 PM   Routine Urinalysis   Color: yellow Appearance: Cloudy Glucose: negative   (Normal Range: Negative) Bilirubin: negative   (Normal Range: Negative) Ketone: negative   (Normal Range: Negative) Spec. Gravity: 1.015   (Normal Range: 1.003-1.035) Blood: negative   (Normal Range: Negative) pH: 5.0   (Normal Range: 5.0-8.0) Protein: trace   (Normal Range: Negative) Urobilinogen: 0.2   (Normal Range: 0-1) Nitrite: positive   (Normal Range: Negative) Leukocyte Esterace: small   (Normal Range: Negative)  Urine Microscopic WBC/HPF: 0-1 RBC/HPF: 0 Bacteria/HPF: 4+ Epithelial/HPF: 0-1      Await U/C results. Shaune Leeks MD  May 04, 2010 3:07 PM

## 2010-07-26 NOTE — Letter (Signed)
Summary: Nadara Eaton letter  [facility]  [address] [place], [place] [address]   Phone: [phone]  Fax: [phone]       02/01/2010 MRN: [id number]  [facility] [address] [place], [place]  [address]  Dear Ms. [facility] [place] and [place] announce the retirement of Arta Silence, M.D., from full-time practice at the [facility] office effective December 23, 2009 and his plans of returning part-time.  It is important to Dr. Hetty Ely and to our practice that you understand that [facility] has seven physicians in our office for your health care needs.  We will continue to offer the same exceptional care that you have today.    Dr. Hetty Ely has spoken to many of you about his plans for retirement and returning part-time in the fall.   We will continue to work with you through the transition to schedule appointments for you in the office and meet the high standards that [facility] is committed to.   Again, it is with great pleasure that we share the news that Dr. Hetty Ely will return to [facility] at [facility] in October of 2011 with a reduced schedule.    If you have any questions, or would like to request an appointment with one of our physicians, please call us at [phone] and press the option for Scheduling an appointment.  We take pleasure in providing you with excellent patient care and look forward to seeing you at your next office visit.  Our [facility] Physicians are:  Tillman Abide, M.D. Laurita Quint, M.D. Roxy Manns, M.D. Kerby Nora, M.D. Hannah Beat, M.D. Ruthe Mannan, M.D. We proudly welcomed Raechel Ache, M.D. and Eustaquio Boyden, M.D. to the practice in July/August 2011.  Sincerely,  [facility]

## 2010-07-26 NOTE — Progress Notes (Signed)
Summary: Wants call back  Phone Note Call from Patient   Caller: Daughter, Joya Salm [phone] Call For: Shaune Leeks MD Summary of Call: VOICEMAIL FROM TRIAGE: daughter calling stating there is an issue with her mother and her being confused alot, she would like to speak with you about this. Initial call taken by: Mervin Hack CMA Duncan Dull),  Nov 08, 2009 2:38 PM  Follow-up for Phone Call        Patient has an appointment scheduled with you on Wednesday and daughter wants you to be aware of some things before she comes in. Patient is going to bed around 3:00PM and then is up at 9:00PM. The other night she was out in the road looking for her paper and it was during the night. She keeps calling her son in the middle of the night thinking that it is morning telling him to come over for breakfast before he goes to work. She has quit washing her clothes. Follow-up by: Sydell Axon LPN,  Nov 08, 2009 4:11 PM  Additional Follow-up for Phone Call Additional follow up Details #1::        Noted. Additional Follow-up by: Shaune Leeks MD,  Nov 08, 2009 4:47 PM

## 2010-07-28 NOTE — Assessment & Plan Note (Signed)
Summary: CHECK URINE/DLO  Nurse Visit   Allergies: No Known Drug Allergies Laboratory Results   Urine Tests  Date/Time Received: June 08, 2010 12:05 PM  Date/Time Reported: June 08, 2010 12:05 PM   Routine Urinalysis   Color: yellow Appearance: Clear Glucose: negative   (Normal Range: Negative) Bilirubin: negative   (Normal Range: Negative) Ketone: negative   (Normal Range: Negative) Spec. Gravity: 1.020   (Normal Range: 1.003-1.035) Blood: negative   (Normal Range: Negative) pH: 5.0   (Normal Range: 5.0-8.0) Protein: trace   (Normal Range: Negative) Urobilinogen: 0.2   (Normal Range: 0-1) Nitrite: negative   (Normal Range: Negative) Leukocyte Esterace: small   (Normal Range: Negative)  Urine Microscopic WBC/HPF: 0-1 RBC/HPF: 0 Bacteria/HPF: 0 Epithelial/HPF: rare       Orders Added: 1)  UA Dipstick W/ Micro (manual) [81000] 2)  Specimen Handling [99000] 3)  T-Urine Culture (Spectrum Order) [[id number]]  Repeat U/A dip and culture. Pls send for culture. Shaune Leeks MD  June 08, 2010 1:07 PM    Urine sent for culture as instructed. Sydell Axon LPN  June 08, 2010 1:42 PM

## 2010-08-10 ENCOUNTER — Encounter: Payer: Self-pay | Admitting: Family Medicine

## 2010-08-23 NOTE — Medication Information (Signed)
Summary: Glucose Testing Supplies  Glucose Testing Supplies   Imported By: Kassie Mends 08/16/2010 09:23:36  _____________________________________________________________________  External Attachment:    Type:   Image     Comment:   External Document

## 2010-10-09 ENCOUNTER — Other Ambulatory Visit: Payer: Self-pay | Admitting: Family Medicine

## 2010-10-26 ENCOUNTER — Encounter: Payer: Self-pay | Admitting: Family Medicine

## 2010-10-27 ENCOUNTER — Ambulatory Visit (INDEPENDENT_AMBULATORY_CARE_PROVIDER_SITE_OTHER): Payer: Medicare Other | Admitting: Family Medicine

## 2010-10-27 ENCOUNTER — Encounter: Payer: Self-pay | Admitting: Family Medicine

## 2010-10-27 DIAGNOSIS — E119 Type 2 diabetes mellitus without complications: Secondary | ICD-10-CM

## 2010-10-27 DIAGNOSIS — F0390 Unspecified dementia without behavioral disturbance: Secondary | ICD-10-CM

## 2010-10-27 DIAGNOSIS — R609 Edema, unspecified: Secondary | ICD-10-CM

## 2010-10-27 DIAGNOSIS — I10 Essential (primary) hypertension: Secondary | ICD-10-CM

## 2010-10-27 DIAGNOSIS — N3941 Urge incontinence: Secondary | ICD-10-CM

## 2010-10-27 DIAGNOSIS — M25473 Effusion, unspecified ankle: Secondary | ICD-10-CM | POA: Insufficient documentation

## 2010-10-27 DIAGNOSIS — G4723 Circadian rhythm sleep disorder, irregular sleep wake type: Secondary | ICD-10-CM

## 2010-10-27 MED ORDER — DONEPEZIL HCL 10 MG PO TABS: 10.0000 mg | ORAL_TABLET | Freq: Every day | ORAL | Status: DC

## 2010-10-27 MED ORDER — LISINOPRIL 10 MG PO TABS: 20.0000 mg | ORAL_TABLET | Freq: Every day | ORAL | Status: DC

## 2010-10-27 NOTE — Assessment & Plan Note (Signed)
By daughter's evidence, sounds like this is stable at this point. Will increase Aricept to try best poss chance of forestalling advancement.

## 2010-10-27 NOTE — Assessment & Plan Note (Signed)
Well controlled. Meds adequate and seems to be taking them appropriately.

## 2010-10-27 NOTE — Assessment & Plan Note (Signed)
Difficult to check on this now via labs as she is very uncooperative with foreign things. Has been stable ion the past so will avoid bloodwork today.

## 2010-10-27 NOTE — Assessment & Plan Note (Signed)
Fairly new onset. Sits a lot during the day and eats lots of salt. This will be difficult because the pt doesn't remember and won't avoid salt. Encouraged family members to avoid giving her salt as much as possible. Elevating feet above the level of the heart would be helpful as well but do not think that will happen. Will increase Lisinopril rather than use diuretic as diuretic will have to be regularly increased. May need to use diuretics anyway.

## 2010-10-27 NOTE — Assessment & Plan Note (Signed)
Seems better controlled. Cont curr med regimen.

## 2010-10-27 NOTE — Progress Notes (Signed)
  Subjective:    Patient ID: Dawn Shea, female    DOB: 11-05-1925, 77 y.o.   MRN: [id number]  HPI Pt here with daughter for eval. She has been having some problems with swelling of both lower extremities. She has a fair amount of salt in her diet. She has no SOB. She otherwise seems stable. The son gets her supper and gives the meds at 5 PM which makes her stay up until at least then. She had been going to bed around 3. She does not hurt anywhere and feels she is eating ok. She is having nml BMs and urination. She has good and bad days with memory. The son is right across the road.  She was to have bloodwork done prior to being seen today which never got accomplished and she declines today.       Review of Systems  Constitutional: Negative for fever, chills, diaphoresis, fatigue and unexpected weight change.  HENT: Negative for hearing loss, ear pain, rhinorrhea, trouble swallowing and tinnitus.   Eyes: Negative for pain, discharge and visual disturbance.  Respiratory: Negative for cough, shortness of breath and wheezing.   Cardiovascular: Negative for chest pain, palpitations and leg swelling.       No Fainting or Fatigue.  Gastrointestinal: Negative for nausea, vomiting, abdominal pain, diarrhea, constipation and blood in stool.       No Heartburn  Genitourinary: Negative for dysuria and frequency.  Musculoskeletal: Negative for myalgias, back pain and arthralgias.  Skin: Negative for rash.       No Itching or Dryness.  Neurological: Negative for tremors and numbness.       No Tingling. No Balance Problems.  Hematological: Negative for adenopathy. Does not bruise/bleed easily.  Psychiatric/Behavioral: Positive for confusion (Doesn't recognize people who are not around regularly.) and agitation (has good days and bad days but getting along ok.). Negative for dysphoric mood.       Memory about stable at this point.    Objective:   Physical Exam  Constitutional: She appears  well-developed and well-nourished. No distress.  HENT:  Head: Normocephalic and atraumatic.  Right Ear: External ear normal.  Left Ear: External ear normal.  Nose: Nose normal.  Mouth/Throat: Oropharynx is clear and moist. No oropharyngeal exudate.  Eyes: Conjunctivae and EOM are normal. Pupils are equal, round, and reactive to light.  Neck: Normal range of motion. Neck supple. No thyromegaly present.  Cardiovascular: Normal rate, regular rhythm and normal heart sounds.   Pulmonary/Chest: Effort normal and breath sounds normal. She has no wheezes. She has no rales.  Abdominal: Soft. Bowel sounds are normal. She exhibits no distension and no mass. There is no tenderness. There is no rebound and no guarding.  Musculoskeletal: She exhibits edema.       Seems to get around ok. Has 2+ edema of the ankles bilat.  Lymphadenopathy:    She has no cervical adenopathy.  Skin: She is not diaphoretic.          Assessment & Plan:

## 2010-10-27 NOTE — Assessment & Plan Note (Signed)
Improved. Seems to be sleeping adequately.

## 2010-11-08 ENCOUNTER — Emergency Department: Payer: Self-pay | Admitting: Emergency Medicine

## 2010-11-09 ENCOUNTER — Encounter: Payer: Self-pay | Admitting: Family Medicine

## 2010-11-09 ENCOUNTER — Ambulatory Visit (INDEPENDENT_AMBULATORY_CARE_PROVIDER_SITE_OTHER): Payer: Medicare Other | Admitting: Family Medicine

## 2010-11-09 VITALS — BP 120/84 | HR 54 | Temp 98.2°F | Ht 63.0 in | Wt 141.0 lb

## 2010-11-09 DIAGNOSIS — F29 Unspecified psychosis not due to a substance or known physiological condition: Secondary | ICD-10-CM

## 2010-11-09 DIAGNOSIS — R300 Dysuria: Secondary | ICD-10-CM

## 2010-11-09 DIAGNOSIS — R410 Disorientation, unspecified: Secondary | ICD-10-CM

## 2010-11-09 NOTE — Patient Instructions (Signed)
STOP ALL ARICEPT RIGHT NOW  TAKE ANTIBIOTICS  RECHECK DR. SCHALLER IN 3-4 WEEKS

## 2010-11-09 NOTE — Progress Notes (Signed)
77 year old female:  F/u [facility] ER visit, yesterday  Was at her son's house yesterday, slipped and fell. Did not hurt anything.   10/29/2010 - Dr. Hetty Ely, discussed a change in her with family. She started to call some in the middle of the night. Confused some in a few days, would have to wake her up in themorning. Daughter went yesterday, help check and take a bath. Yesterday no problem - baseline defiance. A neighbor found yesterday as well, lost.  Went to sleep yesterday, stayed in the house in the afternoon. Went across the road and went under the tree.  Balance was off. Her brother had to take her to the emergency room. Never said anything ot the [facility] ER.   GI symptoms, diarrhea with increased aricept dose. Started the higher dose around 5/10, escalating symptoms surrounding that time.  Also cath urine at [facility] shows signs of potential UTI.  The PMH, PSH, Social History, Family History, Medications, and allergies have been reviewed in [facility], and have been updated if relevant.  [facility] notes reviewed  ROS: confusion noted, no fever, no chills, sweats. Nausea, diarrhea. No URI sx, no coughing.   GEN: WDWN, NAD, Non-toxic, A & O x 3 HEENT: Atraumatic, Normocephalic. Neck supple. No masses, No LAD. Ears and Nose: No external deformity. CV: RRR, No M/G/R. No JVD. No thrill. No extra heart sounds. PULM: CTA B, no wheezes, crackles, rhonchi. No retractions. No resp. distress. No accessory muscle use. ABD: S, NT, ND, +BS. No rebound tenderness. No HSM.  EXTR: No c/c/e Neuro: CN 2-12: mild facial R droop and eyelid droop on R (per daughter this is her norm after prior CVA). PERRLA. EOMI. Sensation intact throughout. Str 5/5 all extremities. DTR 2+. No clonus. A and o x 4. Romberg neg.  PSYCH: Not depressed or anxious appearing.  Calm demeanor.   A/P: Confusion: confusion in setting of baseline alzheimer's, recent increased Aricept and ongoing UTI. Hold Aricept. ABX for infection. At baseline now.  Discussed with family, cannot rule out TIA or small CVA, on ASA, good BP control (ACE held this AM). Cont BP meds. Recheck in 3 weeks. UTI: check urine culture. Start Septra.

## 2010-11-12 LAB — URINE CULTURE: Colony Count: 75000

## 2010-11-14 MED ORDER — CIPROFLOXACIN HCL 250 MG PO TABS: 250.0000 mg | ORAL_TABLET | Freq: Two times a day (BID) | ORAL | Status: AC

## 2010-11-14 NOTE — Progress Notes (Signed)
Addended by: Benny Lennert on: 11/14/2010 09:04 AM   Modules accepted: Orders

## 2010-11-15 ENCOUNTER — Encounter: Payer: Self-pay | Admitting: Family Medicine

## 2010-11-17 ENCOUNTER — Other Ambulatory Visit: Payer: Self-pay | Admitting: Family Medicine

## 2010-11-30 ENCOUNTER — Ambulatory Visit (INDEPENDENT_AMBULATORY_CARE_PROVIDER_SITE_OTHER): Payer: Medicare Other | Admitting: Family Medicine

## 2010-11-30 ENCOUNTER — Encounter: Payer: Self-pay | Admitting: Family Medicine

## 2010-11-30 VITALS — BP 128/80 | HR 72 | Temp 97.2°F | Wt 149.2 lb

## 2010-11-30 DIAGNOSIS — F0390 Unspecified dementia without behavioral disturbance: Secondary | ICD-10-CM

## 2010-11-30 DIAGNOSIS — N390 Urinary tract infection, site not specified: Secondary | ICD-10-CM

## 2010-11-30 DIAGNOSIS — N3941 Urge incontinence: Secondary | ICD-10-CM

## 2010-11-30 LAB — POCT URINALYSIS DIPSTICK
Bilirubin, UA: NEGATIVE
Glucose, UA: NEGATIVE
Ketones, UA: NEGATIVE
Spec Grav, UA: 1.01

## 2010-11-30 NOTE — Progress Notes (Signed)
Addended by: Liane Comber C on: 11/30/2010 12:55 PM   Modules accepted: Orders

## 2010-11-30 NOTE — Patient Instructions (Signed)
Come back as needed

## 2010-11-30 NOTE — Progress Notes (Signed)
  Subjective:    Patient ID: Dawn Shea, female    DOB: Jan 18, 1926, 77 y.o.   MRN: [id number]  HPIPt here with daughter for followup of dementia, swelling of the legs and UTI. She was seen since my visit by Dr Patsy Lager and he held her Aricept. She had been having diarrhea and lightheadedness and problems with agitation which has improved. It sounds like stopping Aricept was a good approach. She was told at the ER she had a UTI and was put on an antibiotic. She was called later and switched to a different AB but the daughter does not know either medication. Turns iout after chart review that Dr Patsy Lager switched her to Cipro from [place].  The pt is doing well and there are no complaints today.    Review of SystemsNoncontributory except as above.       Objective:   Physical ExamWDWN WF NAD HEENT Wnl Heart RRR w/o m Lungs CTA Abd benign Affect nml w/ nml interaction for her, pleasant and minimally talkative unless asked a question.        Assessment & Plan:

## 2010-11-30 NOTE — Assessment & Plan Note (Addendum)
Acceptable today. Will check for UTI resolution. U/A looks benign, will culture. Will call if pos.

## 2010-11-30 NOTE — Assessment & Plan Note (Signed)
Better affect off Aricept. Cont on Namenda only.

## 2010-12-02 LAB — URINE CULTURE
Colony Count: NO GROWTH
Organism ID, Bacteria: NO GROWTH

## 2010-12-22 ENCOUNTER — Other Ambulatory Visit: Payer: Self-pay | Admitting: Family Medicine

## 2011-01-22 ENCOUNTER — Other Ambulatory Visit: Payer: Self-pay | Admitting: Family Medicine

## 2011-04-05 ENCOUNTER — Ambulatory Visit: Payer: Medicare Other

## 2011-04-21 ENCOUNTER — Other Ambulatory Visit: Payer: Self-pay | Admitting: Family Medicine

## 2011-04-21 NOTE — Telephone Encounter (Signed)
Received refill request electronically from pharmacy. Is it okay to refill medication? 

## 2011-06-21 ENCOUNTER — Other Ambulatory Visit: Payer: Self-pay | Admitting: Family Medicine

## 2011-06-23 ENCOUNTER — Other Ambulatory Visit: Payer: Self-pay | Admitting: Family Medicine

## 2011-07-02 ENCOUNTER — Other Ambulatory Visit: Payer: Self-pay | Admitting: Family Medicine

## 2011-07-02 DIAGNOSIS — E119 Type 2 diabetes mellitus without complications: Secondary | ICD-10-CM

## 2011-07-07 ENCOUNTER — Other Ambulatory Visit (INDEPENDENT_AMBULATORY_CARE_PROVIDER_SITE_OTHER): Payer: Medicare Other

## 2011-07-07 DIAGNOSIS — E119 Type 2 diabetes mellitus without complications: Secondary | ICD-10-CM

## 2011-07-07 LAB — LIPID PANEL
Cholesterol: 155 mg/dL (ref 0–200)
HDL: 81.4 mg/dL (ref >39.00)
LDL Cholesterol: 66 mg/dL (ref 0–99)
Total CHOL/HDL Ratio: 2
Triglycerides: 37 mg/dL (ref 0.0–149.0)
VLDL: 7.4 mg/dL (ref 0.0–40.0)

## 2011-07-07 LAB — COMPREHENSIVE METABOLIC PANEL
ALT: 14 U/L (ref 0–35)
AST: 22 U/L (ref 0–37)
Albumin: 4 g/dL (ref 3.5–5.2)
Alkaline Phosphatase: 63 U/L (ref 39–117)
BUN: 31 mg/dL — ABNORMAL HIGH (ref 6–23)
CO2: 29 mEq/L (ref 19–32)
Calcium: 9.6 mg/dL (ref 8.4–10.5)
Chloride: 109 mEq/L (ref 96–112)
Creatinine, Ser: 1.1 mg/dL (ref 0.4–1.2)
GFR: 52.01 mL/min — ABNORMAL LOW (ref >60.00)
Glucose, Bld: 104 mg/dL — ABNORMAL HIGH (ref 70–99)
Potassium: 4.9 mEq/L (ref 3.5–5.1)
Sodium: 145 mEq/L (ref 135–145)
Total Bilirubin: 0.9 mg/dL (ref 0.3–1.2)
Total Protein: 6.7 g/dL (ref 6.0–8.3)

## 2011-07-14 ENCOUNTER — Encounter: Payer: Medicare Other | Admitting: Family Medicine

## 2011-07-27 ENCOUNTER — Other Ambulatory Visit: Payer: Self-pay | Admitting: *Deleted

## 2011-07-27 NOTE — Telephone Encounter (Signed)
Faxed refill request   

## 2011-07-28 MED ORDER — MEMANTINE HCL 10 MG PO TABS: 10.0000 mg | ORAL_TABLET | Freq: Two times a day (BID) | ORAL | Status: DC

## 2011-07-31 ENCOUNTER — Ambulatory Visit (INDEPENDENT_AMBULATORY_CARE_PROVIDER_SITE_OTHER): Payer: Medicare Other | Admitting: Family Medicine

## 2011-07-31 ENCOUNTER — Encounter: Payer: Self-pay | Admitting: Family Medicine

## 2011-07-31 DIAGNOSIS — Z8673 Personal history of transient ischemic attack (TIA), and cerebral infarction without residual deficits: Secondary | ICD-10-CM

## 2011-07-31 DIAGNOSIS — E119 Type 2 diabetes mellitus without complications: Secondary | ICD-10-CM

## 2011-07-31 DIAGNOSIS — I10 Essential (primary) hypertension: Secondary | ICD-10-CM

## 2011-07-31 DIAGNOSIS — F0390 Unspecified dementia without behavioral disturbance: Secondary | ICD-10-CM

## 2011-07-31 DIAGNOSIS — E785 Hyperlipidemia, unspecified: Secondary | ICD-10-CM

## 2011-07-31 DIAGNOSIS — C50919 Malignant neoplasm of unspecified site of unspecified female breast: Secondary | ICD-10-CM

## 2011-07-31 MED ORDER — MEMANTINE HCL 10 MG PO TABS: 10.0000 mg | ORAL_TABLET | Freq: Two times a day (BID) | ORAL | Status: DC

## 2011-07-31 MED ORDER — LISINOPRIL 20 MG PO TABS: 20.0000 mg | ORAL_TABLET | Freq: Every day | ORAL | Status: DC

## 2011-07-31 MED ORDER — SIMVASTATIN 80 MG PO TABS: 80.0000 mg | ORAL_TABLET | Freq: Every day | ORAL | Status: DC

## 2011-07-31 NOTE — Progress Notes (Signed)
Hypertension:    Using medication without problems or lightheadedness: yes Chest pain with exertion:no Edema:no Short of breath:no  Dementia.  Family has to help with bathing, cooking, driving, finances, meds and housework.  On Namenda.  She didn't tolerate the aricept.  Progressive memory loss, but she still known family.  We talked about support groups and continuing care at home.  MMSE 14/30.  Elevated Cholesterol: Using medications without problems:yes Muscle aches: no Diet compliance:yes Exercise:no We talked about simva dose.  She has tolerated w/o ADE.  Labs d/w pt and family.   Diet controlled DM.  No meds.  Not checking sugar regularly.  Family arranges meals.    PMH and SH reviewed  Meds, vitals, and allergies reviewed.   ROS: See HPI.  Otherwise negative.    GEN: nad, alert but not oriented to year HEENT: mucous membranes moist NECK: supple w/o LA CV: rrr. PULM: ctab, no inc wob ABD: soft, +bs EXT: no edema SKIN: no acute rash

## 2011-07-31 NOTE — Patient Instructions (Signed)
Check with your insurance to see if they will cover the shingles shot. I would cut off the oven and cooking stove.  Her memory will likely get worse.  I don't know how much longer she'll be able to stay at home without constant supervision.   I would like to recheck this summer, visit.  Call with questions.   Glad to see you both.

## 2011-08-01 ENCOUNTER — Encounter: Payer: Self-pay | Admitting: Family Medicine

## 2011-08-01 DIAGNOSIS — Z8673 Personal history of transient ischemic attack (TIA), and cerebral infarction without residual deficits: Secondary | ICD-10-CM | POA: Insufficient documentation

## 2011-08-01 DIAGNOSIS — E785 Hyperlipidemia, unspecified: Secondary | ICD-10-CM | POA: Insufficient documentation

## 2011-08-01 NOTE — Assessment & Plan Note (Signed)
Progressive, continue current meds.  Recheck MMSE later this year.  I don't know how long she'll be able to stay at home without constant supervision.  D/w daughter.  With no advance directive, majority of kids will be in position to make decisions for patient as her memory worsens.  >45 min spent with face to face with patient, >50% counseling and/or coordinating care.

## 2011-08-01 NOTE — Assessment & Plan Note (Signed)
No change in meds.

## 2011-08-01 NOTE — Assessment & Plan Note (Signed)
Diet controlled, no new meds.

## 2011-08-01 NOTE — Assessment & Plan Note (Signed)
Given the dementia, it isn't reasonable to pursue mammograms at this point.  I talked with patient and her daughter about this.

## 2011-08-21 ENCOUNTER — Ambulatory Visit (INDEPENDENT_AMBULATORY_CARE_PROVIDER_SITE_OTHER): Payer: Medicare Other | Admitting: Family Medicine

## 2011-08-21 ENCOUNTER — Encounter: Payer: Self-pay | Admitting: Family Medicine

## 2011-08-21 VITALS — BP 142/70 | HR 62 | Temp 97.8°F | Wt 145.0 lb

## 2011-08-21 DIAGNOSIS — N390 Urinary tract infection, site not specified: Secondary | ICD-10-CM | POA: Insufficient documentation

## 2011-08-21 DIAGNOSIS — R300 Dysuria: Secondary | ICD-10-CM

## 2011-08-21 LAB — POCT URINALYSIS DIPSTICK
Bilirubin, UA: NEGATIVE
Ketones, UA: NEGATIVE
Protein, UA: NEGATIVE
Spec Grav, UA: 1.015
pH, UA: 6

## 2011-08-21 MED ORDER — SULFAMETHOXAZOLE-TRIMETHOPRIM 800-160 MG PO TABS: 1.0000 | ORAL_TABLET | Freq: Two times a day (BID) | ORAL | Status: AC

## 2011-08-21 NOTE — Assessment & Plan Note (Signed)
Presumed.  Septra, ucx and fluids with observation by family.

## 2011-08-21 NOTE — Progress Notes (Signed)
Pt doesn't have complaints.   History per family.   She was more confused recently, intermittently, and had to stay at her son's or daughter's house Dysuria: likely, had some urinary frequency noted by family duration of symptoms: a few days abdominal pain:none known fevers:none known back pain: none known Vomiting: none known other concerns: none per patient.    Meds, vitals, and allergies reviewed.   ROS: See HPI.  Otherwise negative.    GEN: nad, alert HEENT: mucous membranes moist NECK: supple CV: rrr.  PULM: ctab, no inc wob ABD: soft, +bs, suprapubic area not tender EXT: no edema SKIN: no acute rash BACK: no CVA pain U/a nit positive.

## 2011-08-21 NOTE — Patient Instructions (Signed)
Drink plenty of water and start the antibiotics today.  We'll contact you with your lab report.  Take care.   

## 2011-08-23 ENCOUNTER — Telehealth: Payer: Self-pay | Admitting: *Deleted

## 2011-08-23 NOTE — Telephone Encounter (Signed)
I called her daughter back.  Patient is going to the bathroom less frequently.  Drinking more fluid now.   I would try the over the counter melatonin for sleep initially.  They can then stop the namenda in 1-2 weeks and see if they note any changes.  If so, we can restart the med.  I left it on the med list for now. I wouldn't make mult changes at one time.   Daughter understood.

## 2011-08-23 NOTE — Telephone Encounter (Signed)
Patients daughter called to see if Dr. Para March will prescribe something for her mom to help her sleep.  Also she wants to know if her mom can discontinue Namenda?  She stated that she doesn't see any good that it is doing and they spend $45 monthly on this medication.  Please advise.  [facility] [address]

## 2011-08-24 LAB — URINE CULTURE: Colony Count: >=100000

## 2011-09-18 ENCOUNTER — Telehealth: Payer: Self-pay | Admitting: Family Medicine

## 2011-09-18 MED ORDER — TRAZODONE HCL 50 MG PO TABS: 25.0000 mg | ORAL_TABLET | Freq: Every day | ORAL | Status: DC

## 2011-09-18 NOTE — Telephone Encounter (Signed)
[facility] Triage Call Report Triage Record Num: [id number] Operator: Caswell Corwin Patient Name: Dawn Shea Call Date & Time: 09/18/2011 11:58:35AM Patient Phone: [phone] PCP: Patient Gender: Female PCP Fax : Patient DOB: [birth year] Practice Name: [facility] Reason for Call: Caller: [name]/Child; PCP: Crawford Givens Clelia Croft); CB#: [phone]; Call regarding Patient Has Alzheimers and Needs Something To Sleep. Recommended OTC Didn T Help. She had tried OTC Melatonin 5 mg 2 pills q night. She started this 2 weeks ago. Pt was last in the ofc end of 2/13. She spoke wtih Dr. Para March and he stated to try Melatonin. Triaged Dementia and having increasing SX. Message to ofc for another med, or to advise that pt be seen. Spoke with Dr. Para March and he will research some info and call them back. Protocol(s) Used: Dementia Recommended Outcome per Protocol: Call Provider within 24 Hours Reason for Outcome: New or increasing symptoms AND taking medications/following therapy as prescribed Care Advice: ~ Call provider if symptoms worsen or new symptoms develop. ~ Call EMS 911 if behavior is threatening or becomes harmful to others or self. 03/

## 2011-09-18 NOTE — Telephone Encounter (Signed)
Daughter advised.

## 2011-09-18 NOTE — Telephone Encounter (Signed)
Call from call a nurse.  Pt still with insomnia.  Has f/u for cough tomorrow.  Have them try trazodone 50mg - 0.5 tab po qhs for insomnia.  She'll see Dr. Dayton Martes tomorrow.  If the insomnia continues, then scheduled f/u with me. Thanks.

## 2011-09-18 NOTE — Telephone Encounter (Signed)
[facility] Triage Call Report Triage Record Num: [id number] Operator: Baldomero Lamy Patient Name: Dawn Shea Call Date & Time: 09/18/2011 11:33:56AM Patient Phone: [phone] PCP: Kerby Nora Patient Gender: Female PCP Fax : [phone] Patient DOB: 12/01/1939 Practice Name: [facility] Reason for Call: Pt calling regarding persistent cough for over a month now. Pt seen at [facility] on 3/22 and started on Cefdinir and is no better. Afebrile. Emergent sxs of URI r/o. Disp 24 hrs. Appt made for 3/26 at 0945 with Dr. Dayton Martes. Protocol(s) Used: Upper Respiratory Infection (URI) Recommended Outcome per Protocol: See Provider within 24 hours Reason for Outcome: Productive cough with colored sputum (other than clear or white sputum) Care Advice: ~ Use a cool mist humidifier to moisten air. Be sure to clean according to manufacturer's instructions. ~ May inhale steam from hot shower or heated water. Be careful to avoid burns. Limit or avoid exposure to irritants and allergens (e.g. air pollution, smoke/smoking, chemicals, dust, pollen, pet dander, etc.) ~ Increase fluids to 8-12 eight oz (1.6 to 2.4 liters) glasses per day, half of them to be water. Soups, popsicles, fruit juices, non-caffeinated sodas (unless restricting sodium intake), jello, broths, decaf teas, etc. are all okay. Warm fluids can be soothing. ~ ~ If you can, stop smoking now and avoid all secondhand smoke. ~ Warm fluids may help, or try a mixture of honey and lemon juice in warm tea. ~ HEALTH PROMOTION / MAINTENANCE ~ SYMPTOM / CONDITION MANAGEMENT ~ INFECTION CONTROL ~ CAUTIONS Coughing up mucus or phlegm helps to get rid of an infection. A productive cough should not be stopped. A cough medicine with guaifenesin (Robitussin, Mucinex) can help loosen the mucus. Cough medicine with dextromethorphan (DM) should be avoided. Drinking lots of fluids can help loosen the mucus too, especially warm  fluids. ~ Analgesic/Antipyretic Advice - Acetaminophen: Consider acetaminophen as directed on label or by pharmacist/provider for pain or fever PRECAUTIONS: - Use if there is no history of liver disease, alcoholism, or intake of three or more alcohol drinks per day - Only if approved by provider during pregnancy or when breastfeeding - During pregnancy, acetaminophen should not be taken more than 3 consecutive days without telling provider - Do not exceed recommended dose or frequency ~ ~ Go to the ED if having chest pain with breathing or breathing is becoming more difficult. Call provider if has a fever over 101.5 F (38.6 C) that has not responded to home care measures, having shaking chills or any fever in someone immunocompromised/frail elderly. ~ Analgesic/Antipyretic Advice - NSAIDs: Consider aspirin, ibuprofen, naproxen or ketoprofen for pain or fever as directed on label or by pharmacist/provider. PRECAUTIONS: - If over 50 years of age, should not take longer than 1 week without consulting provider. EXCEPTIONS: - Should not be used if taking blood thinners or have bleeding problems. - Do not use if have history of sensitivity/allergy to any of these medications; or history of cardiovascular, ulcer, ~ 09/18/2011 11:51:36AM Page 1 of 2 [id number] [facility] Triage Call Report Patient Name: Dawn Shea continuation page/s kidney, liver disease or diabetes unless approved by provider. - Do not exceed recommended dose or frequency. Respiratory Hygiene: - Cover the nose/mouth tightly with a tissue when coughing or sneezing. - Use tissue 1 time and discard in the nearest waste receptacle. - Wash hands with soap and water or alcohol-based hand rub after coming into contact with respiratory secretions and contaminated objects/materials. - Alternatively when no tissue is available, cough into the bend  of the elbow. .Avoid touching your eyes, nose or mouth. ~ 03/

## 2011-10-23 ENCOUNTER — Telehealth: Payer: Self-pay

## 2011-10-23 MED ORDER — TRAZODONE HCL 50 MG PO TABS: 50.0000 mg | ORAL_TABLET | Freq: Every day | ORAL | Status: DC

## 2011-10-23 NOTE — Telephone Encounter (Signed)
Dawn Shea said pt taking Trazodone 50 mg with instructions 1/2 tab at hs for sleep which did not help pt to sleep and  increased Trazodone 50 mg one tab at hs which helped pt to sleep. Please see 09/18/11 phone note. When attempted [facility]ill  [facility] [address] [facility]. Dawn Shea request new rx called in taking 1 at bedtime. Pt last seen 08/21/11. [facility] [address] and Dawn Shea can be reached at [phone].

## 2011-10-23 NOTE — Telephone Encounter (Signed)
Sent. Thanks.   

## 2011-10-23 NOTE — Telephone Encounter (Signed)
[name] advised. 

## 2011-11-09 ENCOUNTER — Encounter: Payer: Self-pay | Admitting: Family Medicine

## 2011-11-09 ENCOUNTER — Ambulatory Visit (INDEPENDENT_AMBULATORY_CARE_PROVIDER_SITE_OTHER): Payer: Medicare Other | Admitting: Family Medicine

## 2011-11-09 VITALS — BP 118/80 | HR 56 | Temp 98.3°F | Ht 63.0 in | Wt 149.8 lb

## 2011-11-09 DIAGNOSIS — R300 Dysuria: Secondary | ICD-10-CM

## 2011-11-09 LAB — POCT URINALYSIS DIPSTICK
Ketones, UA: NEGATIVE
Nitrite, UA: POSITIVE
Protein, UA: NEGATIVE
Urobilinogen, UA: NEGATIVE
pH, UA: 5

## 2011-11-09 MED ORDER — SULFAMETHOXAZOLE-TRIMETHOPRIM 400-80 MG PO TABS: 1.0000 | ORAL_TABLET | Freq: Two times a day (BID) | ORAL | Status: AC

## 2011-11-09 NOTE — Progress Notes (Signed)
Patient presents with burning, urgency. Going to the bathroom more yesterday, drinking not that much durin gthe day. Urine is smelling strong. Pt with alz dz No abd pain, no flank pain.  ROS: GEN:  no fevers, chills. GI: No n/v/d, eating normally Otherwise, ROS is as per the HPI.  PHYSICAL EXAM  Blood pressure 118/80, pulse 56, temperature 98.3 F (36.8 C), temperature source Oral, height 5\' 3"  (1.6 m), weight 149 lb 12.8 oz (67.949 kg), SpO2 98.00%.  GEN: WDWN, A&Ox4,NAD. Non-toxic HEENT: Atraumatc, normocephalic. CV: RRR, No M/G/R PULM: CTA B, No wheezes, crackles, or rhonchi ABD: S, NT, ND, +BS, no rebound. No CVAT. No suprapubic tenderness. EXT: No c/c/e  A/P: UTI. Rx with ABX as below

## 2011-11-19 ENCOUNTER — Other Ambulatory Visit: Payer: Self-pay | Admitting: Family Medicine

## 2011-11-22 ENCOUNTER — Telehealth: Payer: Self-pay | Admitting: Family Medicine

## 2011-11-22 NOTE — Telephone Encounter (Signed)
Caller: [name]/Other; PCP: Crawford Givens Clelia Croft); CB#: [phone]; ; ; Call regarding Dx Kidney Infection, Foul Smelling Urine, Can Another Abx Be Called in; States that pt was diagnosed with Kidney infection 10/25/11, was placed on ABX. Finished ABX last thursday, 11/16/11 ? not sure of exact date. Urine is still smelling strong and is having frequency. No pain. No blood in urine. Afebrile. All emergent sxs per Urinary SXS - Female protocol r/o. Home care advice given. Advised OV within 24 hours. Appt made for 11/23/11 @ 12:00PM with Dr. Dayton Martes, no appts available with PCP.

## 2011-11-22 NOTE — Telephone Encounter (Signed)
Agree, if not improved then needs recheck.  Would not call in abx w/o patient being seen again.

## 2011-11-23 ENCOUNTER — Ambulatory Visit: Payer: Medicare Other | Admitting: Family Medicine

## 2011-11-23 ENCOUNTER — Encounter: Payer: Self-pay | Admitting: Family Medicine

## 2011-11-23 ENCOUNTER — Ambulatory Visit (INDEPENDENT_AMBULATORY_CARE_PROVIDER_SITE_OTHER): Payer: Medicare Other | Admitting: Family Medicine

## 2011-11-23 VITALS — BP 126/66 | HR 66 | Temp 98.2°F | Wt 147.5 lb

## 2011-11-23 DIAGNOSIS — R300 Dysuria: Secondary | ICD-10-CM

## 2011-11-23 DIAGNOSIS — N390 Urinary tract infection, site not specified: Secondary | ICD-10-CM

## 2011-11-23 LAB — POCT URINALYSIS DIPSTICK
Bilirubin, UA: NEGATIVE
Ketones, UA: NEGATIVE
Leukocytes, UA: NEGATIVE
Spec Grav, UA: 1.02
pH, UA: 6

## 2011-11-23 MED ORDER — SULFAMETHOXAZOLE-TRIMETHOPRIM 800-160 MG PO TABS: 1.0000 | ORAL_TABLET | Freq: Two times a day (BID) | ORAL | Status: DC

## 2011-11-23 NOTE — Progress Notes (Signed)
Prev UTI sx were getting better, resolved, but then odor noted again in urine.  Leaking more urine now also.  Sx returned in the last few days.  No pain now or with urination.  No fevers.  She vomited yesterday AM, but it was after she was eating really quickly.  This has happened before.  No other vomiting since then and tolerating meals.  No back or belly pain.  Here with family today.   Meds, vitals, and allergies reviewed.   ROS: See HPI.  Otherwise, noncontributory.  GEN: nad, alert HEENT: mucous membranes moist NECK: supple CV: rrr.  PULM: ctab, no inc wob ABD: soft, +bs, suprapubic area not tender BACK: no CVA pain

## 2011-11-23 NOTE — Patient Instructions (Signed)
Drink plenty of water and start the antibiotics today.  We'll contact you with your lab report.  Take care.   If you have more trouble, let us know.

## 2011-11-23 NOTE — Assessment & Plan Note (Signed)
Restart septra, inc fluids, check ucx.  See notes on labs when resulted.

## 2011-11-27 LAB — URINE CULTURE

## 2011-11-28 ENCOUNTER — Telehealth: Payer: Self-pay | Admitting: Family Medicine

## 2011-11-28 MED ORDER — CEPHALEXIN 500 MG PO CAPS: 500.0000 mg | ORAL_CAPSULE | Freq: Two times a day (BID) | ORAL | Status: DC

## 2011-11-28 NOTE — Telephone Encounter (Signed)
Notify pt/family.  UCx positive, had a different bacteria than prev.  Resistant to septra.  Stop septra.  Needs to change to keflex bid for 7 days.  rx sent.  Thanks.

## 2011-11-28 NOTE — Telephone Encounter (Signed)
Caretaker advised. 

## 2011-12-04 ENCOUNTER — Telehealth: Payer: Self-pay | Admitting: Family Medicine

## 2011-12-04 NOTE — Telephone Encounter (Signed)
Triage Record Num: [id number] Operator: Tana Felts Patient Name: Dawn Shea Call Date & Time: 12/03/2011 8:49:28AM Patient Phone: [phone] PCP: Crawford Givens Patient Gender: Female PCP Fax : Patient DOB: 07-Mar-1926 [facility] Name[facility] Reason for Call: Caller: Doug/Other; PCP: Crawford Givens Clelia Croft); CB#: [phone]; Afebrile, Onset 11/28/11 Call regarding RX changed on 11/28/11-Cephalexin 500mg  BID x 7 days on day 5. Diarrhea, weakness, lower back on left side pain. Void this AM. Dizzy present. Intake is good. All emergent sxs r/o per Diarrhea or Other Changes Protocol. Advised for her to be seen within 24 hours. Offered appt at office, states they are going to take her to UC today. Protocol(s) Used: Diarrhea or Other Change in Bowel Habits Recommended Outcome per Protocol: See Provider within 24 hours Reason for Outcome: Diarrhea began 3-5 days after starting an antibiotic Care Advice: ~ SYMPTOM / CONDITION MANAGEMENT Diarrheal Care: - Drink 2-3 quarts (2-3 liters) per day of low sugar content fluids, including over the counter oral hydration solution, unless directed otherwise by provider. - If accompanied by vomiting, take the fluids in frequent small sips or suck on ice chips. - Eat easily digested foods (such as bananas, rice, applesauce, toast, cooked cereals, soup, crackers, baked or boiled potato, or baked chicken or [place] without skin). - Do not eat high fiber, high fat, high sugar content foods, or highly seasoned foods. - Do not drink caffeinated or alcoholic beverages. - Avoid milk and milk products while having symptoms. As symptoms improve, gradually add back to diet. - Application of A&D ointment or witch hazel medicated pads may help anal irritation. - Antidiarrheal medications are usually unnecessary. If symptoms are severe, consider nonprescription antidiarrheal and anti-motility drugs as directed by label or a provider. Do not take if have high  fever or bloody diarrhea. If pregnant, do not take any medications not approved by your provider. - Consult your provider for advice regarding continuing prescription medication. ~ 12/03/2011 9:06:18AM Page 1 of 1 [email or web address]

## 2011-12-04 NOTE — Telephone Encounter (Signed)
Message left on CAN number to return call.

## 2011-12-04 NOTE — Telephone Encounter (Signed)
See prev phone note.  Please clarify timeline of abx and current meds.  Thanks.

## 2011-12-04 NOTE — Telephone Encounter (Signed)
Addended by: Joaquim Nam on: 12/04/2011 01:43 PM   Modules accepted: Orders, Medications

## 2011-12-04 NOTE — Telephone Encounter (Signed)
Dawn Shea, caretaker, says you changed her ABX on Tuesday (6-4).  She started with diarrhea on Wed (6-5) on the new ABX.  On Saturday (6-8) they stopped the ABX altogether but the diarrhea didn't stop and she was complaining of her back hurting.  The caretaker  called [facility] on Sunday (6-9) and they suggested that she see UC.  They took her to the UC in [place] across from [facility].  The MD there said he thought the infection might have reached her kidneys and she had some dehydration.  He placed her on the most recent ABX and she is doing fine with that medication.  She is still complaining with the back ache and is very weak but diarrhea has subsided.

## 2011-12-04 NOTE — Telephone Encounter (Signed)
Please call and get update on patient. Thanks.  

## 2011-12-04 NOTE — Telephone Encounter (Signed)
Caller: Deborah/Child; PCP: Sunnie Nielsen); CB#: [phone]; Nicole/ granddaughter will be one receiving callback at this number)Call regarding the Antibiotic for Bladder Infection Has Changed To Amoxicillin-K; was advised to follow up with PCP on 12/04/11.  She was on med that caused her to be weak and have duarrhea on 6/8- seen at [facility] across from [facility]).   Wants provider recommendations regarding medications.  Afebrile/tactile.  Has had total of 2 doses of Amocillin-K; still in bed at time of call; reported back pain as of hs on 12/03/11.  Information noted and sent to provider for review and response.

## 2011-12-04 NOTE — Telephone Encounter (Signed)
Noted, thanks, please call and get update again on patient again in 24-28 hours.

## 2011-12-04 NOTE — Telephone Encounter (Signed)
See previous note.  Unable to reach anyone at contact number.  Message left to return call.

## 2011-12-04 NOTE — Telephone Encounter (Signed)
LMOVM to return call with progress report.

## 2011-12-05 NOTE — Telephone Encounter (Signed)
Phoned caretaker and she says the patient is not complaining with her back when she sits still but when she moves around to go to the bathroom, etc., she complains with her back hurting.  Seems to be tolerating this ABX well, no diarrhea or upset stomach.

## 2011-12-05 NOTE — Telephone Encounter (Signed)
Caretaker advised. 

## 2011-12-05 NOTE — Telephone Encounter (Signed)
This is reassuring,  I would continue as is.  The pain could be from sore muscles.  It should improve.  If she doesn't continue to improve, then she'd need to get rechecked.  O/w have them notify us of concerns.  Thanks.

## 2011-12-06 ENCOUNTER — Telehealth: Payer: Self-pay

## 2011-12-06 NOTE — Telephone Encounter (Signed)
I would try imodium once or twice a day.  If this continues or if she feels worse, then she needs to be checked. Thanks.

## 2011-12-06 NOTE — Telephone Encounter (Signed)
Advised patient's daughter. I asked her to call back tomorrow with update on how pt is doing.  Also, advised increase fluids, avoid dairy products.

## 2011-12-06 NOTE — Telephone Encounter (Signed)
Dawn Shea pts daughter said diarrhea started 10 am this morning. Diarrhea x 2 today. When pt gets up complains of lower back pain on both sides, very weak and when tries to walk pt off balance. Pts granddaughter assist walking so pt does not fall. No problems urinating, no fever,No N&V, no abdominal pain, no blood or mucus in diarrhea.[facility].

## 2011-12-18 ENCOUNTER — Ambulatory Visit (INDEPENDENT_AMBULATORY_CARE_PROVIDER_SITE_OTHER): Payer: Medicare Other | Admitting: Family Medicine

## 2011-12-18 ENCOUNTER — Encounter: Payer: Self-pay | Admitting: Family Medicine

## 2011-12-18 VITALS — BP 114/58 | HR 82 | Temp 97.9°F | Wt 150.0 lb

## 2011-12-18 DIAGNOSIS — E119 Type 2 diabetes mellitus without complications: Secondary | ICD-10-CM

## 2011-12-18 DIAGNOSIS — F0390 Unspecified dementia without behavioral disturbance: Secondary | ICD-10-CM

## 2011-12-18 DIAGNOSIS — E785 Hyperlipidemia, unspecified: Secondary | ICD-10-CM

## 2011-12-18 LAB — HEMOGLOBIN A1C: Hgb A1c MFr Bld: 6 % (ref 4.6–6.5)

## 2011-12-18 MED ORDER — TRAZODONE HCL 50 MG PO TABS: 25.0000 mg | ORAL_TABLET | Freq: Every day | ORAL | Status: DC

## 2011-12-18 NOTE — Assessment & Plan Note (Signed)
Continue current meds.  DNR conversation d/w pt's daughter.  She'll talk with family about this.  Continue current meds.  Pt is still pleasant but now has/needs supervision from family.

## 2011-12-18 NOTE — Assessment & Plan Note (Signed)
With likely statin induced knee pain. Would stop statin and use tylenol prn.  They'll call back if not improved.

## 2011-12-18 NOTE — Patient Instructions (Addendum)
Stop the simvastatin for now and see if the joint aches improve.  You can take tylenol if needed for the joint pain.  If the pain continues, let me know.  Go to the lab on the way out.  We'll contact you with your lab report. Recheck in late October with Para March.

## 2011-12-18 NOTE — Progress Notes (Signed)
Caretaker is asking if possibly Simvastatin could be changed to another generic med.  Patient is having a lot of joint pain.  Ninetta Lights, CMA  R>L knee pain.  Going on for the last 2 weeks. On statin.  Known OA but this seems atypical for patient to complain. No trauma.    Dementia.  Had some insomnia, with benefit from the trazodone but was able to come off it- as least for now. May need start back at 1/2 tab in the future.  She has moved in with family and has more supervision.  Family has noted slow decline in independence/memory with some days being better than others.  Often wants to go to bed starting in the early afternoon.    H/o DM2 controlled DM.  No meds.  Hadn't been checking sugar regularly.  Due for A1c.  Weight stable.   Meds, vitals, and allergies reviewed.   ROS: See HPI.  Otherwise, noncontributory.  Nad, not oriented except to the state of [place], follows commands.  Mmm rrr ctab abd soft, not ttp Ext w/o edema MMSE 16/30 B crepitus on knee flexion/ext w/o focal joint line pain or erythema

## 2011-12-18 NOTE — Assessment & Plan Note (Signed)
Diet controlled, check A1c today. See notes on labs.

## 2011-12-21 ENCOUNTER — Telehealth: Payer: Self-pay

## 2011-12-21 NOTE — Telephone Encounter (Signed)
Daughter advised.  Letter left at front desk for pick up.

## 2011-12-21 NOTE — Telephone Encounter (Signed)
pts daughter request letter of incompetency; pt seen 12/18/11.Please advise.

## 2011-12-21 NOTE — Telephone Encounter (Signed)
Done, printed, give to family.

## 2012-03-14 ENCOUNTER — Encounter: Payer: Self-pay | Admitting: Family Medicine

## 2012-03-14 ENCOUNTER — Ambulatory Visit (INDEPENDENT_AMBULATORY_CARE_PROVIDER_SITE_OTHER): Payer: Medicare Other | Admitting: Family Medicine

## 2012-03-14 VITALS — BP 130/76 | HR 57 | Temp 97.6°F | Wt 156.0 lb

## 2012-03-14 DIAGNOSIS — R300 Dysuria: Secondary | ICD-10-CM

## 2012-03-14 DIAGNOSIS — N390 Urinary tract infection, site not specified: Secondary | ICD-10-CM

## 2012-03-14 DIAGNOSIS — Z23 Encounter for immunization: Secondary | ICD-10-CM

## 2012-03-14 DIAGNOSIS — M25473 Effusion, unspecified ankle: Secondary | ICD-10-CM

## 2012-03-14 DIAGNOSIS — R609 Edema, unspecified: Secondary | ICD-10-CM

## 2012-03-14 LAB — POCT URINALYSIS DIPSTICK
Bilirubin, UA: NEGATIVE
Glucose, UA: NEGATIVE
Nitrite, UA: POSITIVE
Urobilinogen, UA: NEGATIVE

## 2012-03-14 MED ORDER — SULFAMETHOXAZOLE-TRIMETHOPRIM 800-160 MG PO TABS: 1.0000 | ORAL_TABLET | Freq: Two times a day (BID) | ORAL | Status: DC

## 2012-03-14 NOTE — Progress Notes (Signed)
L>R leg edema. Noted over last few days.  Worse at the end of the day.  No known salt load.  Not SOB.    Leg aches are better off simvastatin.  Occ HA treated with tylenol, good effect.    Dysuria: no pain but some frequency duration of symptoms: 1-2 days abdominal pain: no Fevers: no back pain:no Vomiting: no  Meds, vitals, and allergies reviewed.   ROS: See HPI.  Otherwise negative.    GEN: nad, alert HEENT: mucous membranes moist NECK: supple CV: rrr.  PULM: ctab, no inc wob ABD: soft, +bs, suprapubic area not tender EXT: no edema SKIN: no acute rash BACK: no CVA pain Trace BLE edema

## 2012-03-14 NOTE — Patient Instructions (Addendum)
Drink plenty of water and start the antibiotics today.  We'll contact you with your lab report.  Take care.   Try to keep your legs elevated in the meantime.

## 2012-03-14 NOTE — Progress Notes (Signed)
Patient did not receive the Flu shot. She was placed on antibotics and the injection was deferred.

## 2012-03-15 ENCOUNTER — Ambulatory Visit: Payer: Medicare Other | Admitting: Family Medicine

## 2012-03-15 ENCOUNTER — Ambulatory Visit (INDEPENDENT_AMBULATORY_CARE_PROVIDER_SITE_OTHER): Payer: Medicare Other | Admitting: Family Medicine

## 2012-03-15 ENCOUNTER — Telehealth: Payer: Self-pay | Admitting: Family Medicine

## 2012-03-15 ENCOUNTER — Ambulatory Visit: Payer: Medicare Other | Admitting: Internal Medicine

## 2012-03-15 ENCOUNTER — Encounter: Payer: Self-pay | Admitting: Family Medicine

## 2012-03-15 VITALS — BP 124/74 | HR 61 | Temp 98.0°F | Wt 156.0 lb

## 2012-03-15 DIAGNOSIS — N390 Urinary tract infection, site not specified: Secondary | ICD-10-CM

## 2012-03-15 MED ORDER — CIPROFLOXACIN HCL 250 MG PO TABS: 250.0000 mg | ORAL_TABLET | Freq: Two times a day (BID) | ORAL | Status: DC

## 2012-03-15 NOTE — Progress Notes (Signed)
Started on septra yesterday.  She was less active and was diffusely weak last night.  This AM she was noted to be soaked in the bed (and it wasn't urine- it was sweat).  She is improved by the time she came to the OV, she was more disoriented last night, better this AM.  This is similar to a prev episode. No pain with urination now per patient.  Still with frequency and urinary odor noted.    Meds, vitals, and allergies reviewed.   ROS: See HPI.  Otherwise, noncontributory.  nad Able to follow commands.  Speech wnl rrr ctab abd not ttp Ext well perfused.   ucx pending.

## 2012-03-15 NOTE — Telephone Encounter (Signed)
Caller: [name]/daughter, Patient Name: Dawn Shea; PCP: Crawford Givens Clelia Croft) [facility]); Best Callback Phone Number: [phone], was seen in office 03/14/12, dx UTI, started on sulfamethoxazole, last dose 1730 03/14/12, possible reaction to drug, got more confused and could not walk easily, was breathing more deeply, become better in a few hours, slept fine during the night, currently asleep now Allergic Reaction Protocol Utilized.   03/15/12 1015 appt scheduled.

## 2012-03-15 NOTE — Patient Instructions (Signed)
Don't take septra again.  Change to cipro.  Notify me with an update Monday.  Take care.

## 2012-03-15 NOTE — Telephone Encounter (Signed)
Spoke with daughter.  They are getting her in the car now and appt was switched from Dr. Alphonsus Sias to Dr. Lianne Bushy schedule.

## 2012-03-15 NOTE — Telephone Encounter (Signed)
Spoke with daughter and she states pt had 1st dose of med at 1:00pm and 2 dose at 6pm, daughter states pt got more confused and not alert before going to bed at 8:15. Daughter states they had to get patient out of bed this morning and she was " soaked from head to toe" I advised do not give her more medication, until she hear from Dr. Para March,  daughter would like pt to be seen by Dr.Duncan, appt was scheduled with Dr.Letvak by mistake. Please advise on what time appt should be?

## 2012-03-15 NOTE — Assessment & Plan Note (Signed)
Elevate leg and follow clinically.  Lungs ctab.

## 2012-03-15 NOTE — Assessment & Plan Note (Addendum)
Presumed, start septra, check ucx and f/u prn.    See notes on labs and following notes.  Resistant to cipro.  Will change to keflex.  rx sent.

## 2012-03-15 NOTE — Telephone Encounter (Signed)
Get her in whenever possible today.  I had talked with the daughter before rxing septra and we didn't think she had an intolerance for this medicine.

## 2012-03-17 NOTE — Assessment & Plan Note (Signed)
Presumed septra reaction, continue to treat for UTI.  Change to cipro. Family to monitor over the weekend.  Nontoxic.  Okay for outpatient f/u.

## 2012-03-18 LAB — URINE CULTURE: Colony Count: >=100000

## 2012-03-18 MED ORDER — CEPHALEXIN 500 MG PO CAPS: 500.0000 mg | ORAL_CAPSULE | Freq: Two times a day (BID) | ORAL | Status: DC

## 2012-03-18 NOTE — Addendum Note (Signed)
Addended by: Joaquim Nam on: 03/18/2012 04:56 PM   Modules accepted: Orders

## 2012-04-08 ENCOUNTER — Encounter: Payer: Self-pay | Admitting: Family Medicine

## 2012-04-08 ENCOUNTER — Ambulatory Visit (INDEPENDENT_AMBULATORY_CARE_PROVIDER_SITE_OTHER): Payer: Medicare Other | Admitting: Family Medicine

## 2012-04-08 VITALS — BP 122/66 | HR 64 | Temp 98.2°F | Wt 155.0 lb

## 2012-04-08 DIAGNOSIS — Z23 Encounter for immunization: Secondary | ICD-10-CM

## 2012-04-08 DIAGNOSIS — F0390 Unspecified dementia without behavioral disturbance: Secondary | ICD-10-CM

## 2012-04-08 MED ORDER — LORAZEPAM 0.5 MG PO TABS: 0.2500 mg | ORAL_TABLET | Freq: Two times a day (BID) | ORAL | Status: DC | PRN

## 2012-04-08 NOTE — Progress Notes (Signed)
Urinary sx are resolved.    She has been slightly agitated recently, has been episodically picking at her hands.  Had been chewing her nails in last ~1 month.  Fingers frequently in mouth.  Memory changes are at baseline.  Trying to keep regular sleep cycle.  Trazodone helps with sleep cycle.  We talked about options for the episodic agitation.   H/o CVA.  She wasn't on ASA at the time of the CVA.  She has had more bruising recently.  We talked about risk and benefit of the medicine.  We elected to continue as is after discussion with daughter.    Meds, vitals, and allergies reviewed.   ROS: See HPI.  Otherwise, noncontributory.  NAD Not oriented to year or her birth year.  Does know day/month of birth and her name Mild bruising on L hand, not ttp ncat Mmm rrr ctab abd soft, not ttp

## 2012-04-08 NOTE — Patient Instructions (Addendum)
Use the lorazepam twice a day as needed for agitation. It can make you drowsy.  Call with an update as needed.  Take care.

## 2012-04-09 NOTE — Assessment & Plan Note (Signed)
No hallucinations.  Would avoid antipsychotics if possible.  Use lorazepam for agitation prn.  Sedation caution.  This should help.  Use lowest possible dose.  D/w pt's daughter.  She agrees.  Call back or f/u prn.

## 2012-04-11 ENCOUNTER — Other Ambulatory Visit: Payer: Self-pay | Admitting: *Deleted

## 2012-04-12 ENCOUNTER — Other Ambulatory Visit: Payer: Self-pay

## 2012-04-12 MED ORDER — TRAZODONE HCL 50 MG PO TABS: 25.0000 mg | ORAL_TABLET | Freq: Every day | ORAL | Status: DC

## 2012-04-12 NOTE — Telephone Encounter (Signed)
Stanton Kidney called for refill trazadone. Advised sent to [facility]. Stanton Kidney will pick up rx.

## 2012-04-12 NOTE — Telephone Encounter (Signed)
Sent!

## 2012-06-03 ENCOUNTER — Telehealth: Payer: Self-pay | Admitting: Family Medicine

## 2012-06-03 NOTE — Telephone Encounter (Signed)
Patient Information:  Caller Name: Stanton Kidney  Phone: [phone]  Patient: Dawn Shea, Dawn Shea  Gender: Female  DOB: 1926-04-12  Age: 78 Years  PCP: Crawford Givens Clelia Croft) [facility])   Symptoms  Reason For Call & Symptoms: Called regarding dry cough with rhinorrhea  Reviewed Health History In EMR: Yes  Reviewed Medications In EMR: Yes  Reviewed Allergies In EMR: Yes  Reviewed Surgeries / Procedures: Yes  Date of Onset of Symptoms: 05/30/2012  Treatments Tried: claritin, Robitussin  Treatments Tried Worked: No  Guideline(s) Used:  Colds  Disposition Per Guideline:   Home Care  Reason For Disposition Reached:   Colds with no complications  Advice Given:  Reassurance  It sounds like an uncomplicated cold that we can treat at home.  Colds are very common and may make you feel uncomfortable.  Colds are caused by viruses, and no medicine or "shot" will cure an uncomplicated cold.  Colds are usually not serious.  Here is some care advice that should help.  For a Runny Nose With Profuse Discharge:   Nasal mucus and discharge helps to wash viruses and bacteria out of the nose and sinuses.  Blowing the nose is all that is needed.  For a Stuffy Nose - Use Nasal Washes:  Introduction: Saline (salt water) nasal irrigation (nasal wash) is an effective and simple home remedy for treating stuffy nose and sinus congestion. The nose can be irrigated by pouring, spraying, or squirting salt water into the nose and then letting it run back out.  How it Helps: The salt water rinses out excess mucus, washes out any irritants (dust, allergens) that might be present, and moistens the nasal cavity.  Methods: There are several ways to perform nasal irrigation. You can use a saline nasal spray bottle (available over-the-counter), a rubber ear syringe, a medical syringe without the needle, or a Neti Pot.  Humidifier:  If the air in your home is dry, use a cool-mist humidifier  Contagiousness:  The cold  virus is present in your nasal secretions.  Cover your nose and mouth with a tissue when you sneeze or cough.  Wash your hands frequently with soap and water.  Expected Course:   Fever may last 2-3 days  Nasal discharge 7-14 days  Cough up to 2-3 weeks.  Call Back If:  Difficulty breathing occurs  Fever lasts more than 3 days  Nasal discharge lasts more than 10 days  Cough lasts more than 3 weeks  You become worse  Nasal Decongestants for a Very Stuffy or Runny Nose:  Most people do Not need to use these medicines. If your nose feels blocked, you should try using nasal washes first.  Cough Medicines:  OTC Cough Drops: Cough drops can help a lot, especially for mild coughs. They reduce coughing by soothing your irritated throat and removing that tickle sensation in the back of the throat. Cough drops also have the advantage of portability - you can carry them with you.  Home Remedy - Hard Candy: Hard candy works just as well as medicine-flavored OTC cough drops. Diabetics should use sugar-free candy.  Home Remedy - Honey: This old home remedy has been shown to help decrease coughing at night. The adult dosage is 2 teaspoons (10 ml) at bedtime.   Office Follow Up:  Does the office need to follow up with this patient?: No  Instructions For The Office: N/A  RN Note:  No behavior changes.  Appetite and fluid intake per normal. Afebrile.

## 2012-07-29 ENCOUNTER — Ambulatory Visit (INDEPENDENT_AMBULATORY_CARE_PROVIDER_SITE_OTHER): Payer: Medicare Other | Admitting: Family Medicine

## 2012-07-29 ENCOUNTER — Encounter: Payer: Self-pay | Admitting: Family Medicine

## 2012-07-29 VITALS — BP 116/68 | HR 73 | Temp 97.3°F

## 2012-07-29 DIAGNOSIS — R300 Dysuria: Secondary | ICD-10-CM

## 2012-07-29 DIAGNOSIS — N390 Urinary tract infection, site not specified: Secondary | ICD-10-CM

## 2012-07-29 LAB — POCT URINALYSIS DIPSTICK
Bilirubin, UA: NEGATIVE
Blood, UA: NEGATIVE
Glucose, UA: NEGATIVE
Nitrite, UA: POSITIVE
Urobilinogen, UA: NEGATIVE

## 2012-07-29 MED ORDER — CIPROFLOXACIN HCL 250 MG PO TABS: 250.0000 mg | ORAL_TABLET | Freq: Two times a day (BID) | ORAL | Status: DC

## 2012-07-29 NOTE — Progress Notes (Signed)
History per daughter.  Per family, inc in urinary frequency, less UOP per void, her balance was altered and her urine didn't smell normally.  No fevers known.  No vomiting, no back pain, no abd pain.    Meds, vitals, and allergies reviewed.   ROS: See HPI.  Otherwise, noncontributory.  GEN: nad, alert and demented at baseline (does know her birthdate) NECK: supple CV: rrr.  PULM: ctab, no inc wob ABD: soft, +bs, suprapubic area not tender EXT: no edema SKIN: no acute rash BACK: no CVA pain

## 2012-07-29 NOTE — Patient Instructions (Addendum)
Drink plenty of water and start the antibiotics today.  We'll contact you with your lab report.  Take care.   

## 2012-07-30 DIAGNOSIS — N390 Urinary tract infection, site not specified: Secondary | ICD-10-CM | POA: Insufficient documentation

## 2012-07-30 NOTE — Assessment & Plan Note (Signed)
Presumed, nontoxic, fluids, cipro, ucx pending . F/u prn.

## 2012-08-01 LAB — URINE CULTURE: Colony Count: >=100000

## 2012-08-02 ENCOUNTER — Other Ambulatory Visit: Payer: Self-pay | Admitting: Family Medicine

## 2012-08-02 MED ORDER — CEPHALEXIN 500 MG PO CAPS: 500.0000 mg | ORAL_CAPSULE | Freq: Two times a day (BID) | ORAL | Status: DC

## 2012-08-23 ENCOUNTER — Telehealth: Payer: Self-pay

## 2012-08-23 ENCOUNTER — Other Ambulatory Visit: Payer: Self-pay | Admitting: Family Medicine

## 2012-08-23 NOTE — Telephone Encounter (Signed)
Lady called to refill pts memory med,cholesterol med and sleeping pills; does not know name of meds. [facility] know [facility] of [facility]s pt needs. Lady voiced understanding.

## 2012-09-02 ENCOUNTER — Other Ambulatory Visit: Payer: Self-pay | Admitting: Family Medicine

## 2012-09-02 NOTE — Telephone Encounter (Signed)
Electronic refill request.  Please advise. 

## 2012-09-03 NOTE — Telephone Encounter (Signed)
Sent!

## 2012-09-06 ENCOUNTER — Encounter: Payer: Self-pay | Admitting: Family Medicine

## 2012-09-06 ENCOUNTER — Other Ambulatory Visit: Payer: Self-pay | Admitting: *Deleted

## 2012-09-06 ENCOUNTER — Ambulatory Visit (INDEPENDENT_AMBULATORY_CARE_PROVIDER_SITE_OTHER): Payer: Medicare Other | Admitting: Family Medicine

## 2012-09-06 VITALS — BP 122/60 | HR 82 | Temp 97.7°F

## 2012-09-06 DIAGNOSIS — R300 Dysuria: Secondary | ICD-10-CM

## 2012-09-06 DIAGNOSIS — Z66 Do not resuscitate: Secondary | ICD-10-CM | POA: Insufficient documentation

## 2012-09-06 LAB — POCT URINALYSIS DIPSTICK
Nitrite, UA: NEGATIVE
Spec Grav, UA: 1.02
Urobilinogen, UA: NEGATIVE
pH, UA: 6

## 2012-09-06 MED ORDER — CIPROFLOXACIN HCL 250 MG PO TABS: 250.0000 mg | ORAL_TABLET | Freq: Two times a day (BID) | ORAL | Status: DC

## 2012-09-06 NOTE — Progress Notes (Signed)
Family noted urinary frequency and change in urine odor.  Sx started about 2 days ago.  No mental status changes, at baseline.  She denies pain or SOB.  No abd pain.  She did vomit this AM per family; this wasn't a choking episode. No fevers.    Family was asking about DNR forms, family has discussed.  Pt had expressed wishes about DNR in the past to family.  Daughter is HCPOA.  3 forms done for patient and given to family.   Meds, vitals, and allergies reviewed.   ROS: See HPI.  Otherwise negative.    GEN: nad, alert but not oriented- at baseline- pleasant HEENT: mucous membranes moist NECK: supple CV: rrr.  PULM: ctab, no inc wob ABD: soft, +bs, suprapubic area not tender EXT: no edema SKIN: no acute rash BACK: no CVA pain

## 2012-09-06 NOTE — Assessment & Plan Note (Signed)
Papers done per family request.  This is reasonable.

## 2012-09-06 NOTE — Assessment & Plan Note (Signed)
Presumed, check ucx and start cipro.  Encourage PO fluids and f/u prn.  Family agrees.

## 2012-09-06 NOTE — Patient Instructions (Signed)
Drink plenty of water and start the antibiotics today.  We'll contact you with your lab report.  Take care.   

## 2012-09-10 ENCOUNTER — Other Ambulatory Visit: Payer: Self-pay | Admitting: *Deleted

## 2012-09-10 MED ORDER — CEPHALEXIN 500 MG PO CAPS: 500.0000 mg | ORAL_CAPSULE | Freq: Two times a day (BID) | ORAL | Status: DC

## 2013-03-25 ENCOUNTER — Ambulatory Visit (INDEPENDENT_AMBULATORY_CARE_PROVIDER_SITE_OTHER): Payer: Medicare Other

## 2013-03-25 DIAGNOSIS — Z23 Encounter for immunization: Secondary | ICD-10-CM

## 2013-05-25 ENCOUNTER — Other Ambulatory Visit: Payer: Self-pay | Admitting: Family Medicine

## 2013-06-06 ENCOUNTER — Other Ambulatory Visit: Payer: Self-pay | Admitting: Family Medicine

## 2013-06-11 ENCOUNTER — Other Ambulatory Visit: Payer: Self-pay | Admitting: Family Medicine

## 2013-08-23 ENCOUNTER — Other Ambulatory Visit: Payer: Self-pay | Admitting: Family Medicine

## 2013-09-02 ENCOUNTER — Encounter: Payer: Self-pay | Admitting: Family Medicine

## 2013-09-02 ENCOUNTER — Ambulatory Visit (INDEPENDENT_AMBULATORY_CARE_PROVIDER_SITE_OTHER): Payer: Medicare Other | Admitting: Family Medicine

## 2013-09-02 VITALS — BP 118/70 | HR 85 | Temp 97.7°F | Wt 167.5 lb

## 2013-09-02 DIAGNOSIS — E119 Type 2 diabetes mellitus without complications: Secondary | ICD-10-CM

## 2013-09-02 DIAGNOSIS — L989 Disorder of the skin and subcutaneous tissue, unspecified: Secondary | ICD-10-CM

## 2013-09-02 DIAGNOSIS — F0390 Unspecified dementia without behavioral disturbance: Secondary | ICD-10-CM

## 2013-09-02 DIAGNOSIS — N390 Urinary tract infection, site not specified: Secondary | ICD-10-CM

## 2013-09-02 DIAGNOSIS — R300 Dysuria: Secondary | ICD-10-CM

## 2013-09-02 LAB — POCT URINALYSIS DIPSTICK
BILIRUBIN UA: NEGATIVE
Glucose, UA: NEGATIVE
Ketones, UA: NEGATIVE
NITRITE UA: POSITIVE
PH UA: 5
Spec Grav, UA: 1.015
UROBILINOGEN UA: NEGATIVE

## 2013-09-02 MED ORDER — LISINOPRIL 20 MG PO TABS: 20.0000 mg | ORAL_TABLET | Freq: Every day | ORAL | Status: AC

## 2013-09-02 MED ORDER — CEPHALEXIN 500 MG PO CAPS: 500.0000 mg | ORAL_CAPSULE | Freq: Two times a day (BID) | ORAL | Status: AC

## 2013-09-02 MED ORDER — CEPHALEXIN 500 MG PO CAPS: 500.0000 mg | ORAL_CAPSULE | Freq: Two times a day (BID) | ORAL | Status: DC

## 2013-09-02 NOTE — Patient Instructions (Signed)
Drink plenty of water and start the antibiotics today.  We'll contact you with your lab report.  Take care.   Think about going to the derm clinic and let me know if you need a referral.

## 2013-09-02 NOTE — Progress Notes (Signed)
Pre visit review using our clinic review tool, if applicable. No additional management support is needed unless otherwise documented below in the visit note. Barely enough urine for dipstick, probably not enough for culture and sensitivity but I'll try if you need it.  Mike Craze, CMA  Dementia continues.  This is a strain for the family.  They are considering options. She if off most meds now. See EMR list. Tapered off meds prev by family, w/o sig worsening.  Family is looking into options.  She is not oriented to family now.  She needs full time care, provided with great effort by family.  Pt's daughter is taking a larger load of the care than pt's son.    Dysuria: yes, dec in urination frequency, lack of awareness of leaking urine.  duration of symptoms: 1-2 days abdominal pain: none known fevers:no back pain:not known vomiting:no  Meds, vitals, and allergies reviewed.   ROS: See HPI.  Otherwise negative.    GEN: nad, alert and but not oriented at baseline.  HEENT: mucous membranes moist NECK: supple CV: rrr.  PULM: ctab, no inc wob ABD: soft, +bs, suprapubic area not tender EXT: no edema SKIN: no acute rash but 1cm irritated papule on the L cheek, present for months per family.  BACK: no CVA pain

## 2013-09-02 NOTE — Assessment & Plan Note (Signed)
Possible SCC, can refer to derm if family desires. They'll need to consider if the procedure would be worthwhile for the patient.  She may pick at the excision site making this hard to heal.  I'll defer to family.

## 2013-09-02 NOTE — Assessment & Plan Note (Signed)
Dementia continues.  This is a strain for the family.  They are considering options. She if off most meds now. See EMR list. Tapered off meds prev by family, w/o sig worsening.  Family is looking into options.  She is not oriented to family now.  She needs full time care, provided with great effort by family.  Pt's daughter is taking a larger load of the care than pt's son.  I'll work on the [employer] when needed.

## 2013-09-02 NOTE — Assessment & Plan Note (Signed)
She is off meds, no glucose in urine today and given her dementia I would limit w/u and dietary restrictions at this point.

## 2013-09-02 NOTE — Assessment & Plan Note (Signed)
Presumed, keflex and check ucx if possible, if enough urine.  Nontoxic.  D/w pt's family.

## 2013-09-05 ENCOUNTER — Telehealth: Payer: Self-pay

## 2013-09-05 LAB — URINE CULTURE: Colony Count: >=100000

## 2013-09-05 NOTE — Telephone Encounter (Signed)
Relevant patient education mailed to patient.  

## 2014-03-11 ENCOUNTER — Ambulatory Visit (INDEPENDENT_AMBULATORY_CARE_PROVIDER_SITE_OTHER): Payer: Medicare Other | Admitting: *Deleted

## 2014-03-11 DIAGNOSIS — Z23 Encounter for immunization: Secondary | ICD-10-CM

## 2014-03-18 ENCOUNTER — Other Ambulatory Visit: Payer: Self-pay | Admitting: Family Medicine

## 2014-03-18 NOTE — Telephone Encounter (Signed)
Mrs Dawn Shea called for lisinopril refill; [facility] and advised to [facility].

## 2014-07-13 ENCOUNTER — Emergency Department: Payer: Self-pay | Admitting: Emergency Medicine

## 2014-08-28 ENCOUNTER — Telehealth: Payer: Self-pay | Admitting: Family Medicine

## 2014-08-28 NOTE — Telephone Encounter (Signed)
2014/09/14.  Call from Dr. Randie Heinz, ER MD.  Late entry.   Patient noted to have AMS by family, brought to ER via EMS.  Had tachycardia, likely sepsis with PNA on xray.  No CVA on CT per report.  Despite care, patient died in ER.  I will sign death certificate.   I called her family later that night and talked with them re: the above.  They thanked me for the call.   Please update chart.  Date of death September 14, 2014.  Thanks.

## 2014-08-28 NOTE — Telephone Encounter (Signed)
Morey Hummingbird advised to update chart.

## 2014-09-15 ENCOUNTER — Other Ambulatory Visit: Payer: Self-pay | Admitting: Family Medicine

## 2014-09-25 DEATH — deceased

## 2016-12-12 IMAGING — CT CT HEAD WITHOUT CONTRAST
3 of 4 series · 15 of 47 positions shown, 18 images · non-contrast
Comparison: 11/08/2010

CLINICAL DATA: Status post fall, left infraorbital bruising,
disoriented

EXAM:
CT HEAD WITHOUT CONTRAST
CT MAXILLOFACIAL WITHOUT CONTRAST
TECHNIQUE: Multidetector CT imaging of the head and maxillofacial structures
were performed using the standard protocol without intravenous
contrast. Multiplanar CT image reconstructions of the maxillofacial
structures were also generated.

[Series 3: max soft · axial · 0.31mm/px · z∈[-243,-115]mm · 9 of 81 slices shown, 12 images]
[im 9/81  brain]
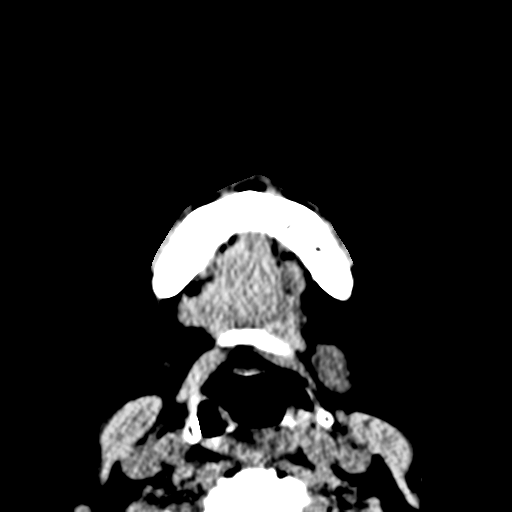
[im 9/81  bone]
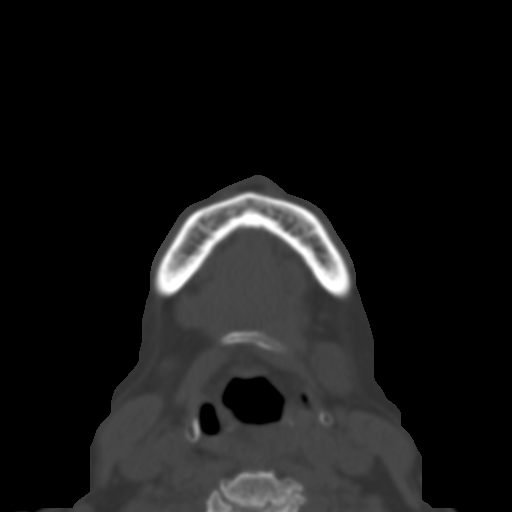
[im 17/81  brain]
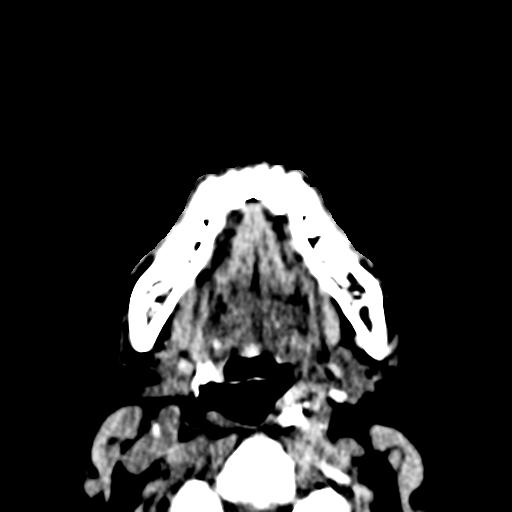
[im 25/81  brain]
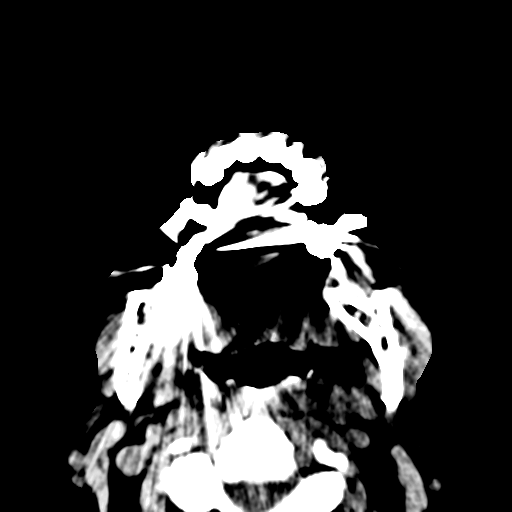
[im 33/81  brain]
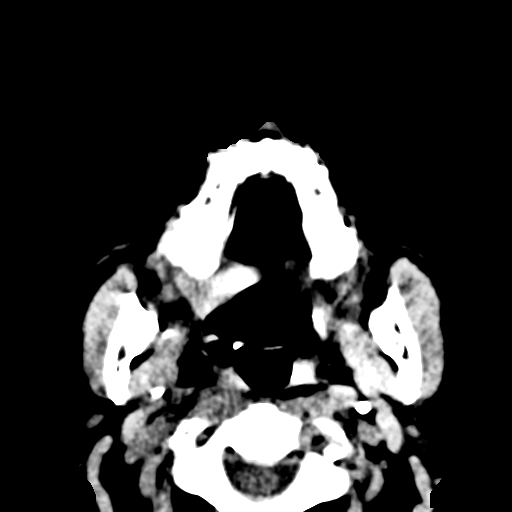
[im 41/81  brain]
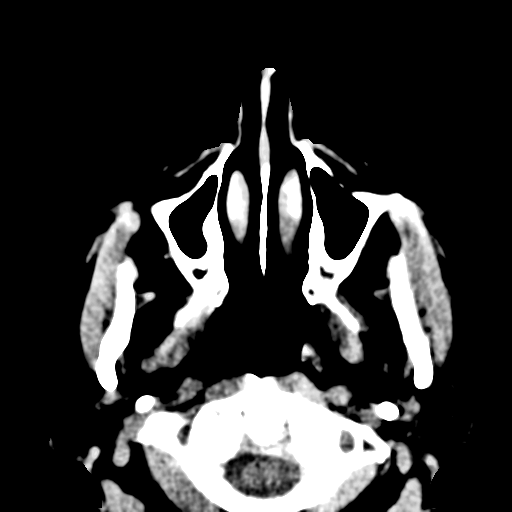
[im 41/81  bone]
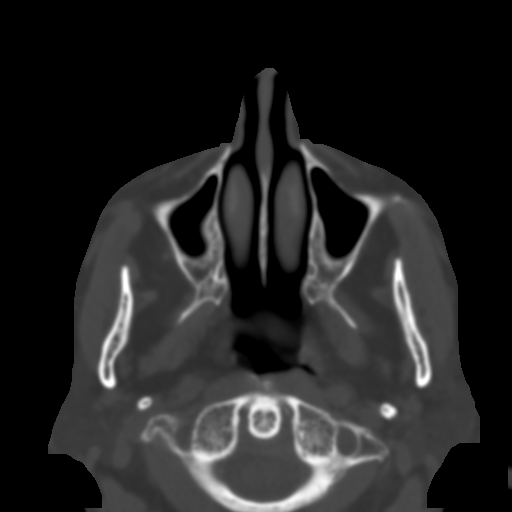
[im 49/81  brain]
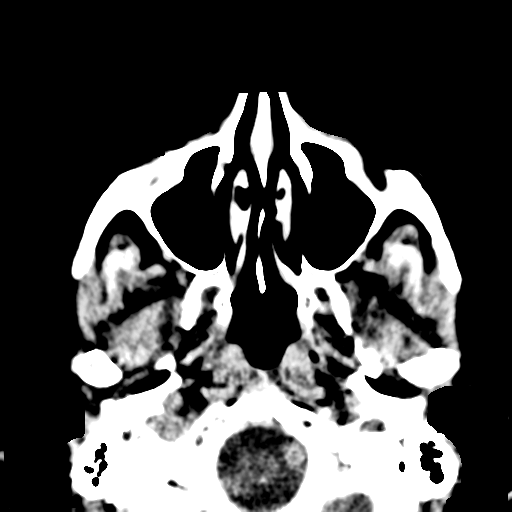
[im 57/81  brain]
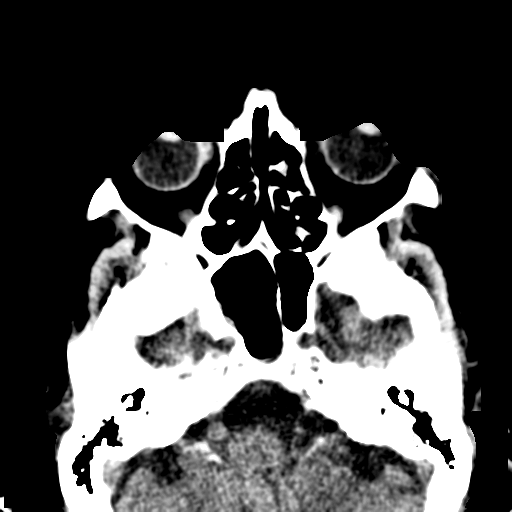
[im 65/81  brain]
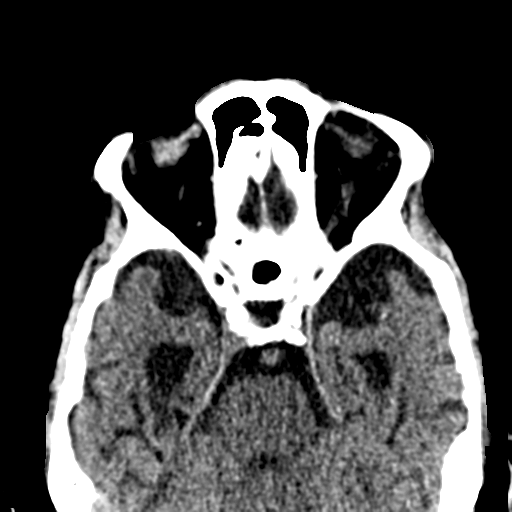
[im 73/81  brain]
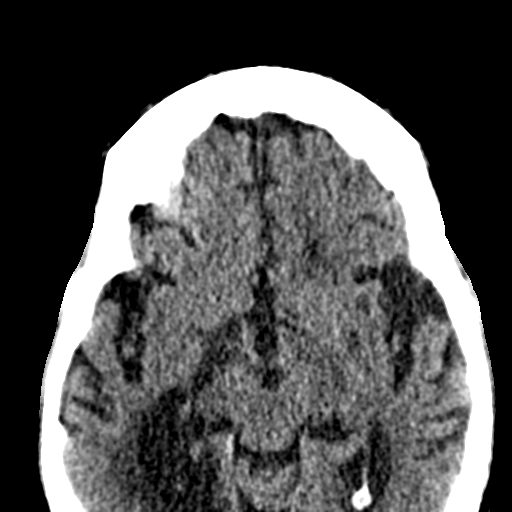
[im 73/81  bone]
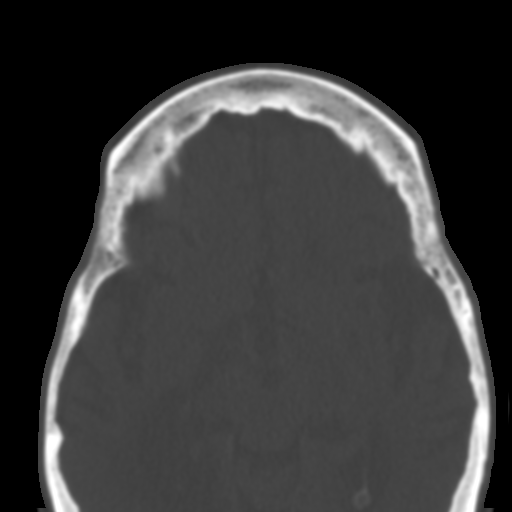

[Series 6: coronal soft · coronal · 0.27mm/px · 3 of 77 slices shown]
[im 26/77  brain]
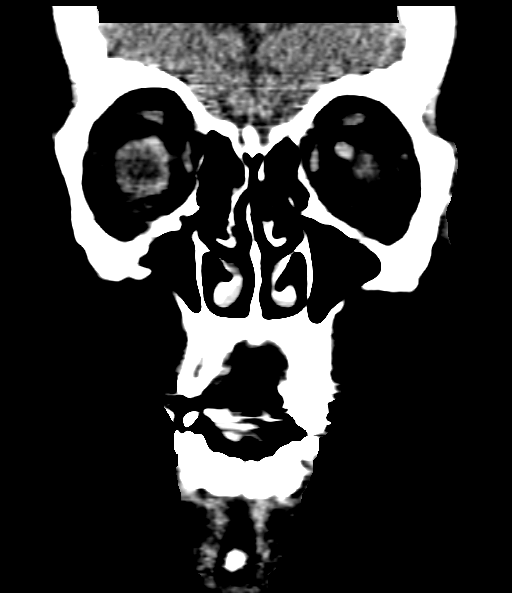
[im 34/77  brain]
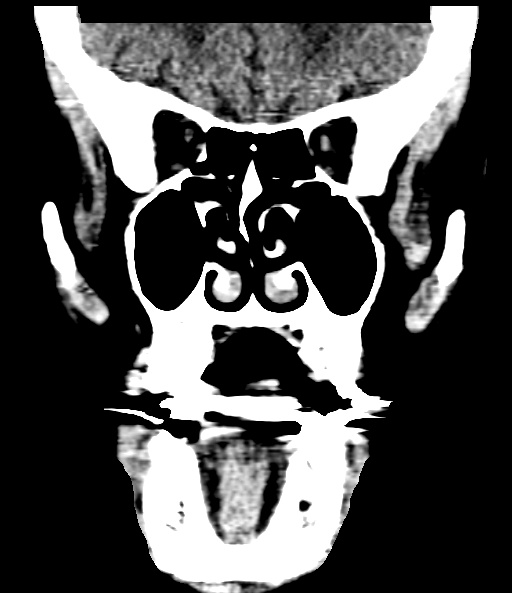
[im 43/77  brain]
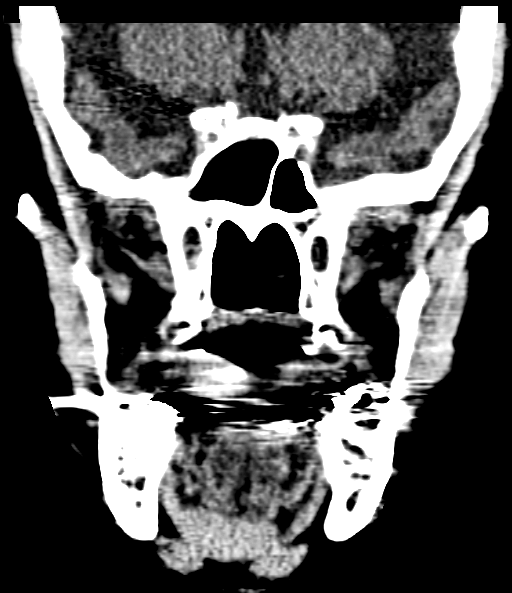

[Series 7: sagittal soft · sagittal · 0.30mm/px · 3 of 79 slices shown]
[im 27/79  brain]
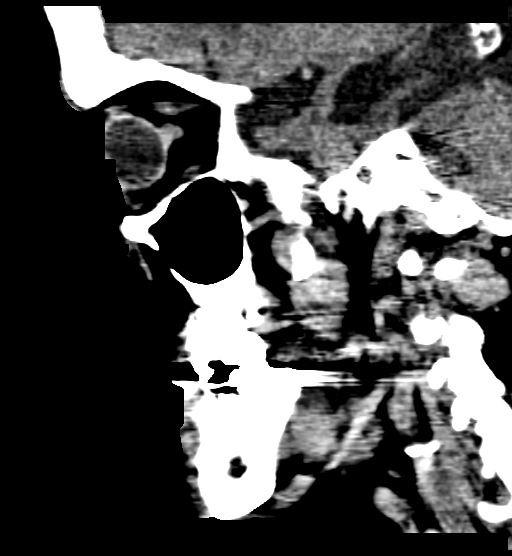
[im 40/79  brain]
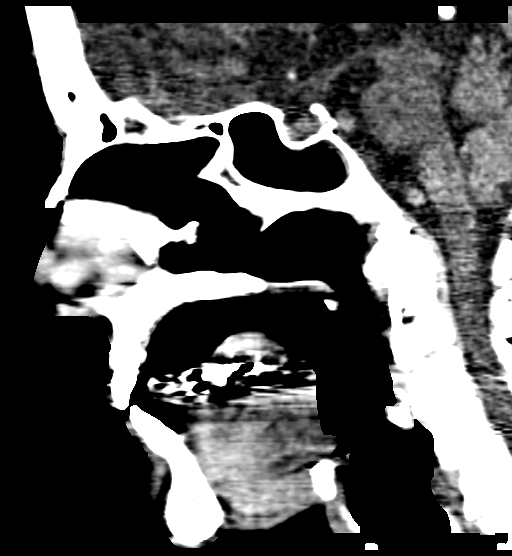
[im 53/79  brain]
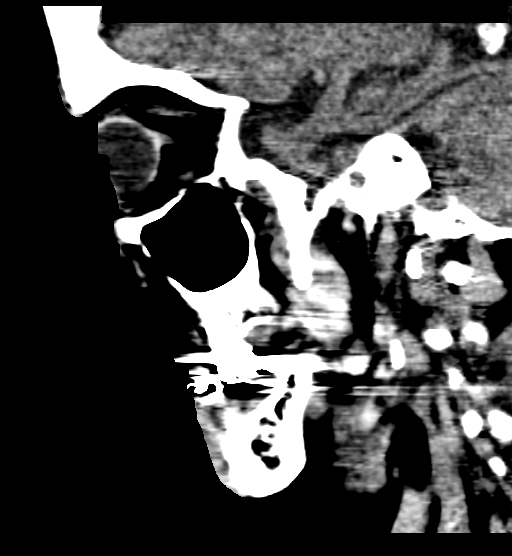

[15 of 47 positions shown; findings below may reference images not displayed]

FINDINGS: CT HEAD FINDINGS

There is no evidence of mass effect, midline shift, or extra-axial
fluid collections. There is no evidence of a space-occupying lesion
or intracranial hemorrhage. There is no evidence of a cortical-based
area of acute infarction. There is a large area of encephalomalacia
in the right parieto-occipital lobe. There is generalized cerebral
atrophy. There is periventricular white matter low attenuation
likely secondary to microangiopathy.

The ventricles and sulci are appropriate for the patient's age. The
basal cisterns are patent.

Visualized portions of the orbits are unremarkable. There is mild
right sphenoid sinus mucosal thickening. Cerebrovascular
atherosclerotic calcifications are noted.

The osseous structures are unremarkable. There is left periorbital
soft tissue swelling.

CT MAXILLOFACIAL FINDINGS

There is left periorbital soft tissue swelling. The globes are
intact. The orbital walls are intact. The orbital floors are intact.
The maxilla is intact. The mandible is intact. The zygomatic arches
are intact. The nasal septum is midline. There is no nasal bone
fracture. The temporomandibular joints are normal.

There is right sphenoid sinus mucosal thickening. The visualized
portions of the mastoid sinuses are well aerated.

There is degenerative disc disease at C5-6 partially visualized.
IMPRESSION: 1. No acute intracranial pathology.
2. No acute osseous injury of the maxillofacial bones.
